# Patient Record
Sex: Male | Born: 1995 | Race: Black or African American | Hispanic: No | Marital: Single | Smoking: Never smoker
Health system: Southern US, Community
[De-identification: ages and names within clinical notes are randomized; demographics above are authoritative.]

---
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
## 2015-03-23 ENCOUNTER — Emergency Department (HOSPITAL_COMMUNITY)
Admission: EM | Admit: 2015-03-23 | Discharge: 2015-03-23 | Disposition: A | Discharge status: Home / Self Care | Payer: Medicaid Other | Attending: Emergency Medicine | Admitting: Emergency Medicine

## 2015-03-23 ENCOUNTER — Encounter (HOSPITAL_COMMUNITY): Payer: Self-pay | Admitting: *Deleted

## 2015-03-23 ENCOUNTER — Emergency Department (HOSPITAL_COMMUNITY): Payer: Medicaid Other

## 2015-03-23 DIAGNOSIS — Y998 Other external cause status: Secondary | ICD-10-CM | POA: Insufficient documentation

## 2015-03-23 DIAGNOSIS — S161XXA Strain of muscle, fascia and tendon at neck level, initial encounter: Secondary | ICD-10-CM

## 2015-03-23 DIAGNOSIS — S299XXA Unspecified injury of thorax, initial encounter: Secondary | ICD-10-CM | POA: Insufficient documentation

## 2015-03-23 DIAGNOSIS — Y92410 Unspecified street and highway as the place of occurrence of the external cause: Secondary | ICD-10-CM | POA: Insufficient documentation

## 2015-03-23 DIAGNOSIS — S0990XA Unspecified injury of head, initial encounter: Secondary | ICD-10-CM | POA: Insufficient documentation

## 2015-03-23 DIAGNOSIS — Y9389 Activity, other specified: Secondary | ICD-10-CM | POA: Insufficient documentation

## 2015-03-23 MED ORDER — METHOCARBAMOL 500 MG PO TABS: 500.0000 mg | ORAL_TABLET | Freq: Three times a day (TID) | ORAL | Status: DC | PRN

## 2015-03-23 MED ORDER — IBUPROFEN 200 MG PO TABS
600.0000 mg | ORAL_TABLET | Freq: Once | ORAL | Status: AC
  Administered 2015-03-23: 600 mg via ORAL
  Filled 2015-03-23: qty 3

## 2015-03-23 NOTE — ED Notes (Signed)
Family at bedside. 

## 2015-03-23 NOTE — ED Notes (Addendum)
Pt escorted to discharge window. Verbalized understanding discharge instructions. In no acute distress.  Pt educated that he will feel worse to tomorrow and reason to return.  Pt verbalized understanding.

## 2015-03-23 NOTE — Discharge Instructions (Signed)
Cervical Sprain °A cervical sprain is an injury in the neck in which the strong, fibrous tissues (ligaments) that connect your neck bones stretch or tear. Cervical sprains can range from mild to severe. Severe cervical sprains can cause the neck vertebrae to be unstable. This can lead to damage of the spinal cord and can result in serious nervous system problems. The amount of time it takes for a cervical sprain to get better depends on the cause and extent of the injury. Most cervical sprains heal in 1 to 3 weeks. °CAUSES  °Severe cervical sprains may be caused by:  °· Contact sport injuries (such as from football, rugby, wrestling, hockey, auto racing, gymnastics, diving, martial arts, or boxing).   °· Motor vehicle collisions.   °· Whiplash injuries. This is an injury from a sudden forward and backward whipping movement of the head and neck.  °· Falls.   °Mild cervical sprains may be caused by:  °· Being in an awkward position, such as while cradling a telephone between your ear and shoulder.   °· Sitting in a chair that does not offer proper support.   °· Working at a poorly designed computer station.   °· Looking up or down for long periods of time.   °SYMPTOMS  °· Pain, soreness, stiffness, or a burning sensation in the front, back, or sides of the neck. This discomfort may develop immediately after the injury or slowly, 24 hours or more after the injury.   °· Pain or tenderness directly in the middle of the back of the neck.   °· Shoulder or upper back pain.   °· Limited ability to move the neck.   °· Headache.   °· Dizziness.   °· Weakness, numbness, or tingling in the hands or arms.   °· Muscle spasms.   °· Difficulty swallowing or chewing.   °· Tenderness and swelling of the neck.   °DIAGNOSIS  °Most of the time your health care provider can diagnose a cervical sprain by taking your history and doing a physical exam. Your health care provider will ask about previous neck injuries and any known neck  problems, such as arthritis in the neck. X-rays may be taken to find out if there are any other problems, such as with the bones of the neck. Other tests, such as a CT scan or MRI, may also be needed.  °TREATMENT  °Treatment depends on the severity of the cervical sprain. Mild sprains can be treated with rest, keeping the neck in place (immobilization), and pain medicines. Severe cervical sprains are immediately immobilized. Further treatment is done to help with pain, muscle spasms, and other symptoms and may include: °· Medicines, such as pain relievers, numbing medicines, or muscle relaxants.   °· Physical therapy. This may involve stretching exercises, strengthening exercises, and posture training. Exercises and improved posture can help stabilize the neck, strengthen muscles, and help stop symptoms from returning.   °HOME CARE INSTRUCTIONS  °· Put ice on the injured area.   °¨ Put ice in a plastic bag.   °¨ Place a towel between your skin and the bag.   °¨ Leave the ice on for 15-20 minutes, 3-4 times a day.   °· If your injury was severe, you may have been given a cervical collar to wear. A cervical collar is a two-piece collar designed to keep your neck from moving while it heals. °¨ Do not remove the collar unless instructed by your health care provider. °¨ If you have long hair, keep it outside of the collar. °¨ Ask your health care provider before making any adjustments to your collar. Minor   adjustments may be required over time to improve comfort and reduce pressure on your chin or on the back of your head. °¨ If you are allowed to remove the collar for cleaning or bathing, follow your health care provider's instructions on how to do so safely. °¨ Keep your collar clean by wiping it with mild soap and water and drying it completely. If the collar you have been given includes removable pads, remove them every 1-2 days and hand wash them with soap and water. Allow them to air dry. They should be completely  dry before you wear them in the collar. °¨ If you are allowed to remove the collar for cleaning and bathing, wash and dry the skin of your neck. Check your skin for irritation or sores. If you see any, tell your health care provider. °¨ Do not drive while wearing the collar.   °· Only take over-the-counter or prescription medicines for pain, discomfort, or fever as directed by your health care provider.   °· Keep all follow-up appointments as directed by your health care provider.   °· Keep all physical therapy appointments as directed by your health care provider.   °· Make any needed adjustments to your workstation to promote good posture.   °· Avoid positions and activities that make your symptoms worse.   °· Warm up and stretch before being active to help prevent problems.   °SEEK MEDICAL CARE IF:  °· Your pain is not controlled with medicine.   °· You are unable to decrease your pain medicine over time as planned.   °· Your activity level is not improving as expected.   °SEEK IMMEDIATE MEDICAL CARE IF:  °· You develop any bleeding. °· You develop stomach upset. °· You have signs of an allergic reaction to your medicine.   °· Your symptoms get worse.   °· You develop new, unexplained symptoms.   °· You have numbness, tingling, weakness, or paralysis in any part of your body.   °MAKE SURE YOU:  °· Understand these instructions. °· Will watch your condition. °· Will get help right away if you are not doing well or get worse. °Document Released: 08/25/2007 Document Revised: 11/02/2013 Document Reviewed: 05/05/2013 °[employer]® Patient Information ©2015 [employer]. This information is not intended to replace advice given to you by your health care provider. Make sure you discuss any questions you have with your health care provider. ° °Motor Vehicle Collision °It is common to have multiple bruises and sore muscles after a motor vehicle collision (MVC). These tend to feel worse for the first 24 hours. You may have  the most stiffness and soreness over the first several hours. You may also feel worse when you wake up the first morning after your collision. After this point, you will usually begin to improve with each day. The speed of improvement often depends on the severity of the collision, the number of injuries, and the location and nature of these injuries. °HOME CARE INSTRUCTIONS °· Put ice on the injured area. °¨ Put ice in a plastic bag. °¨ Place a towel between your skin and the bag. °¨ Leave the ice on for 15-20 minutes, 3-4 times a day, or as directed by your health care provider. °· Drink enough fluids to keep your urine clear or pale yellow. Do not drink alcohol. °· Take a warm shower or bath once or twice a day. This will increase blood flow to sore muscles. °· You may return to activities as directed by your caregiver. Be careful when lifting, as this may aggravate neck or back   pain. °· Only take over-the-counter or prescription medicines for pain, discomfort, or fever as directed by your caregiver. Do not use aspirin. This may increase bruising and bleeding. °SEEK IMMEDIATE MEDICAL CARE IF: °· You have numbness, tingling, or weakness in the arms or legs. °· You develop severe headaches not relieved with medicine. °· You have severe neck pain, especially tenderness in the middle of the back of your neck. °· You have changes in bowel or bladder control. °· There is increasing pain in any area of the body. °· You have shortness of breath, light-headedness, dizziness, or fainting. °· You have chest pain. °· You feel sick to your stomach (nauseous), throw up (vomit), or sweat. °· You have increasing abdominal discomfort. °· There is blood in your urine, stool, or vomit. °· You have pain in your shoulder (shoulder strap areas). °· You feel your symptoms are getting worse. °MAKE SURE YOU: °· Understand these instructions. °· Will watch your condition. °· Will get help right away if you are not doing well or get  worse. °Document Released: 10/28/2005 Document Revised: 03/14/2014 Document Reviewed: 03/27/2011 °Exit[employer]® Patient Information ©2015 [employer]. This information is not intended to replace advice given to you by your health care provider. Make sure you discuss any questions you have with your health care provider. ° °
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
## 2015-04-04 ENCOUNTER — Emergency Department (HOSPITAL_COMMUNITY): Payer: Medicaid Other

## 2015-04-04 ENCOUNTER — Encounter (HOSPITAL_COMMUNITY): Payer: Self-pay

## 2015-04-04 ENCOUNTER — Emergency Department (HOSPITAL_COMMUNITY)
Admission: EM | Admit: 2015-04-04 | Discharge: 2015-04-06 | Disposition: A | Discharge status: Home / Self Care | Payer: Self-pay | Attending: Emergency Medicine | Admitting: Emergency Medicine

## 2015-04-04 DIAGNOSIS — F0630 Mood disorder due to known physiological condition, unspecified: Secondary | ICD-10-CM

## 2015-04-04 DIAGNOSIS — R4182 Altered mental status, unspecified: Secondary | ICD-10-CM | POA: Insufficient documentation

## 2015-04-04 DIAGNOSIS — R443 Hallucinations, unspecified: Secondary | ICD-10-CM

## 2015-04-04 DIAGNOSIS — F1994 Other psychoactive substance use, unspecified with psychoactive substance-induced mood disorder: Secondary | ICD-10-CM

## 2015-04-04 LAB — CBC
HCT: 44.3 % (ref 39.0–52.0)
HEMOGLOBIN: 15.5 g/dL (ref 13.0–17.0)
MCH: 31.3 pg (ref 26.0–34.0)
MCHC: 35 g/dL (ref 30.0–36.0)
MCV: 89.3 fL (ref 78.0–100.0)
Platelets: 236 10*3/uL (ref 150–400)
RBC: 4.96 MIL/uL (ref 4.22–5.81)
RDW: 12 % (ref 11.5–15.5)
WBC: 15.8 10*3/uL — ABNORMAL HIGH (ref 4.0–10.5)

## 2015-04-04 LAB — COMPREHENSIVE METABOLIC PANEL
ALK PHOS: 62 U/L (ref 38–126)
ALT: 16 U/L — ABNORMAL LOW (ref 17–63)
ANION GAP: 13 (ref 5–15)
AST: 25 U/L (ref 15–41)
Albumin: 5.2 g/dL — ABNORMAL HIGH (ref 3.5–5.0)
BUN: 20 mg/dL (ref 6–20)
CO2: 25 mmol/L (ref 22–32)
Calcium: 9.9 mg/dL (ref 8.9–10.3)
Chloride: 100 mmol/L — ABNORMAL LOW (ref 101–111)
Creatinine, Ser: 1.16 mg/dL (ref 0.61–1.24)
GFR calc non Af Amer: >60 mL/min (ref >60)
Glucose, Bld: 145 mg/dL — ABNORMAL HIGH (ref 65–99)
POTASSIUM: 4 mmol/L (ref 3.5–5.1)
SODIUM: 138 mmol/L (ref 135–145)
Total Bilirubin: 0.8 mg/dL (ref 0.3–1.2)
Total Protein: 8.4 g/dL — ABNORMAL HIGH (ref 6.5–8.1)

## 2015-04-04 LAB — RAPID URINE DRUG SCREEN, HOSP PERFORMED
Amphetamines: NOT DETECTED
BARBITURATES: NOT DETECTED
BENZODIAZEPINES: NOT DETECTED
Cocaine: NOT DETECTED
Opiates: NOT DETECTED
Tetrahydrocannabinol: POSITIVE — AB

## 2015-04-04 LAB — ETHANOL: Alcohol, Ethyl (B): <5 mg/dL (ref <5)

## 2015-04-04 LAB — ACETAMINOPHEN LEVEL: Acetaminophen (Tylenol), Serum: <10 ug/mL — ABNORMAL LOW (ref 10–30)

## 2015-04-04 LAB — SALICYLATE LEVEL

## 2015-04-04 MED ORDER — ZOLPIDEM TARTRATE 5 MG PO TABS
5.0000 mg | ORAL_TABLET | Freq: Every evening | ORAL | Status: DC | PRN
  Administered 2015-04-04: 5 mg via ORAL
  Filled 2015-04-04: qty 1

## 2015-04-04 MED ORDER — ALUM & MAG HYDROXIDE-SIMETH 200-200-20 MG/5ML PO SUSP: 30.0000 mL | ORAL | Status: DC | PRN

## 2015-04-04 MED ORDER — ACETAMINOPHEN 325 MG PO TABS: 650.0000 mg | ORAL_TABLET | ORAL | Status: DC | PRN

## 2015-04-04 MED ORDER — LORAZEPAM 1 MG PO TABS: 1.0000 mg | ORAL_TABLET | Freq: Once | ORAL | Status: DC

## 2015-04-04 MED ORDER — ONDANSETRON HCL 4 MG PO TABS: 4.0000 mg | ORAL_TABLET | Freq: Three times a day (TID) | ORAL | Status: DC | PRN

## 2015-04-04 MED ORDER — IBUPROFEN 200 MG PO TABS: 600.0000 mg | ORAL_TABLET | Freq: Three times a day (TID) | ORAL | Status: DC | PRN

## 2015-04-04 MED ORDER — LORAZEPAM 1 MG PO TABS
1.0000 mg | ORAL_TABLET | Freq: Three times a day (TID) | ORAL | Status: DC | PRN
  Administered 2015-04-04: 1 mg via ORAL
  Filled 2015-04-04: qty 1

## 2015-04-04 NOTE — ED Notes (Signed)
Per Mom, pt has taken an unknown substance earlier today.  Pt is lethargic and speech is slurred.  Does not know what he took.  He is having hallucinations

## 2015-04-04 NOTE — ED Notes (Signed)
Pt attempted to get up and talk with the assessment counselors and he was unable to. Pt was assisted to get up and go to the restroom and was unsteady on his feet.
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
## 2015-04-04 NOTE — ED Notes (Signed)
Pt threw down his drink to the floor and attempted to get out of the bed after being asked to stay into bed. Pt yelling out of his room for his medicines. When asked what medications that he was wanting pt was unable to tell me. Pt then attempted to grab staff and was redirected and told not to do this.

## 2015-04-04 NOTE — BH Assessment (Addendum)
Reviewed ED notes prior to initiating assessment. Per family reports pt was quieter than usual yesterday and then exhibited bizarre behaviors starting around 3 am. Pt ingested something but is not sure what, friend told him it was Xanax, pt did not test positive for Xanax, did test positive for THC. Pt reports visual hallucinations.    Requested cart be placed with pt for assessment. Per nurse Sedonia SmallNatashia pt is very sedated, and had to be assisted with changing his clothes. She states he is not able to participate in an assessment. She will contact TTS at [phone] or [phone] when pt is ready to be assessed.   Informed Dr. Micheline Mazeocherty what nurse had stated she stated pt did not make much sense was alert enough to be assessed. She went into room and was able to arouse pt. Assessment to commence shortly.   Attempted to assess pt. He was having trouble holding his head up and answering questions. He called for nurse and she had to physically assist him in using the restroom as pt could not ambulate or take down his pants independently. Informed EDP. [facility] will attempt to assess Face to Face and if unable to do so due to pt's drowsy state will inform EDP.    Clista BernhardtNancy Cypher Paule, [facility] Triage Specialist 04/04/2015 9:51 PM

## 2015-04-04 NOTE — ED Notes (Signed)
Bed: WA28 Expected date:  Expected time:  Means of arrival:  Comments: 

## 2015-04-05 DIAGNOSIS — F0630 Mood disorder due to known physiological condition, unspecified: Secondary | ICD-10-CM

## 2015-04-05 DIAGNOSIS — R443 Hallucinations, unspecified: Secondary | ICD-10-CM | POA: Insufficient documentation

## 2015-04-05 DIAGNOSIS — F1994 Other psychoactive substance use, unspecified with psychoactive substance-induced mood disorder: Secondary | ICD-10-CM

## 2015-04-05 MED ORDER — DIPHENHYDRAMINE HCL 25 MG PO CAPS
50.0000 mg | ORAL_CAPSULE | Freq: Once | ORAL | Status: AC
  Administered 2015-04-05: 50 mg via ORAL
  Filled 2015-04-05: qty 2

## 2015-04-05 MED ORDER — TRAZODONE HCL 50 MG PO TABS
50.0000 mg | ORAL_TABLET | Freq: Every evening | ORAL | Status: DC | PRN
  Administered 2015-04-05: 50 mg via ORAL
  Filled 2015-04-05: qty 1

## 2015-04-05 MED ORDER — DIPHENHYDRAMINE HCL 50 MG/ML IJ SOLN
50.0000 mg | Freq: Once | INTRAMUSCULAR | Status: AC
  Administered 2015-04-05: 50 mg via INTRAMUSCULAR
  Filled 2015-04-05: qty 1

## 2015-04-05 MED ORDER — OLANZAPINE 10 MG PO TBDP
10.0000 mg | ORAL_TABLET | Freq: Three times a day (TID) | ORAL | Status: DC | PRN
  Administered 2015-04-05: 10 mg via ORAL
  Filled 2015-04-05: qty 1

## 2015-04-05 MED ORDER — HALOPERIDOL LACTATE 5 MG/ML IJ SOLN
INTRAMUSCULAR | Status: AC
  Administered 2015-04-05: 5 mg via INTRAMUSCULAR
  Filled 2015-04-05: qty 1

## 2015-04-05 MED ORDER — LORAZEPAM 2 MG/ML IJ SOLN
2.0000 mg | Freq: Once | INTRAMUSCULAR | Status: AC
  Administered 2015-04-05: 2 mg via INTRAMUSCULAR
  Filled 2015-04-05: qty 1

## 2015-04-05 MED ORDER — HALOPERIDOL LACTATE 5 MG/ML IJ SOLN: 5.0000 mg | Freq: Once | INTRAMUSCULAR | Status: AC

## 2015-04-05 NOTE — ED Notes (Signed)
Mother at bedside, patient sleeping.

## 2015-04-05 NOTE — BH Assessment (Signed)
Tele Assessment Note   Lance Nguyen is an 19 y.o. male.  -Clinician talked with Dr. Micheline Maze about need for TTS.  Patient was brought in by parents who noticed that for the last day or so pt has been acting bizarrely.  Patient said that he took a pill but cannot name what it was.  We discussed taking out the consult request but the nurse informed clinician that patient was up and talking at the edge of his bed.  Patient is still not making much sense when you ask him questions.  He does not know where he is or the date and day.  When asked about SI or HI patient says "No."  Patient will occasionally look behind him as if someone is there.  He will look into the hallway as if looking at someone.  Patient still is not oriented.  He does have a ankle bracelet for tracking that he is worried about.  He will say things that lead you to believe he is talking about him not being in the right place for the ankle monitor.  Patient is unable to say what pill he may have taken was.  Patient's friends told parents he took a xanax.  Only THC was on UDS.  -Clinician discussed disposition with Donell Sievert, PA who recommends inpatient care but that patient needs AM psych evaluation when more coherent.  Clinician did talk to Dr. Micheline Maze about patient needing to be seen by psychiatry in AM on 05/25.  Axis I: Substance Abuse and Substance Induced Mood Disorder Axis II: Deferred Axis III: History reviewed. No pertinent past medical history. Axis IV: other psychosocial or environmental problems, problems related to legal system/crime and problems with access to health care services Axis V: 21-30 behavior considerably influenced by delusions or hallucinations OR serious impairment in judgment, communication OR inability to function in almost all areas  Past Medical History: History reviewed. No pertinent past medical history.  History reviewed. No pertinent past surgical history.  Family History: History  reviewed. No pertinent family history.  Social History:  reports that he has never smoked. He does not have any smokeless tobacco history on file. He reports that he does not drink alcohol or use illicit drugs.  Additional Social History:  Alcohol / Drug Use Pain Medications: Pt unable to tell Prescriptions:  Pt unable to tell Over the Counter: Pt unable to tell History of alcohol / drug use?: Yes Substance #1 Name of Substance 1: Marijuana 1 - Age of First Use: Teens 1 - Amount (size/oz): Unknown 1 - Frequency: Unable to assess 1 - Duration: Unable to assess. 1 - Last Use / Amount: Unknown  CIWA: CIWA-Ar BP: 145/85 mmHg Pulse Rate: 77 COWS:    PATIENT STRENGTHS: (choose at least two) Average or above average intelligence Supportive family/friends  Allergies: No Known Allergies  Home Medications:  (Not in a hospital admission)  OB/GYN Status:  No LMP for male patient.  General Assessment Data Location of Assessment: [facility] TTS Assessment: In system Is this a Tele or Face-to-Face Assessment?: Face-to-Face Is this an Initial Assessment or a Re-assessment for this encounter?: Initial Assessment Marital status: Single Is patient pregnant?: No Pregnancy Status: No Living Arrangements: Parent Can pt return to current living arrangement?: Yes Admission Status: Voluntary Is patient capable of signing voluntary admission?: No Referral Source: Self/Family/Friend Insurance type: self pay     Crisis Care Plan Living Arrangements: Parent Name of Psychiatrist: Unknown Name of Therapist: Unknown  Risk to self with the past 6 months Suicidal Ideation: No Has patient been a risk to self within the past 6 months prior to admission? : No Suicidal Intent: No Has patient had any suicidal intent within the past 6 months prior to admission? : No Is patient at risk for suicide?: No Suicidal Plan?: No Has patient had any suicidal plan within the past 6 months prior to  admission? : No Access to Means: No What has been your use of drugs/alcohol within the last 12 months?: Pills & THC Previous Attempts/Gestures: No How many times?: 0 Other Self Harm Risks: Unknown Triggers for Past Attempts: None known Intentional Self Injurious Behavior: None Family Suicide History: Unable to assess Recent stressful life event(s): Legal Issues (Pt has ankle monitor in left leg) Persecutory voices/beliefs?: Yes Depression: No Depression Symptoms:  (Unable to assess) Substance abuse history and/or treatment for substance abuse?: Yes Suicide prevention information given to non-admitted patients: Not applicable  Risk to Others within the past 6 months Homicidal Ideation: No Does patient have any lifetime risk of violence toward others beyond the six months prior to admission? : No Thoughts of Harm to Others: No Current Homicidal Intent: No Current Homicidal Plan: No Access to Homicidal Means: No Identified Victim: No one History of harm to others?: No Assessment of Violence: None Noted Violent Behavior Description: Unknown Does patient have access to weapons?: No Criminal Charges Pending?: Yes Describe Pending Criminal Charges: Pt could not say Does patient have a court date: Yes Court Date: 04/07/15 (Pt unsure but mentioned 05/27) Is patient on probation?: Yes  Psychosis Hallucinations: Auditory, Visual Delusions: None noted  Mental Status Report Appearance/Hygiene: Disheveled, In scrubs Eye Contact: Fair Motor Activity: Unsteady Speech: Rapid, Pressured, Slurred Level of Consciousness: Alert Mood: Anxious, Suspicious, Apprehensive, Helpless Affect: Apprehensive, Frightened Anxiety Level: Severe Thought Processes: Irrelevant, Flight of Ideas Judgement: Impaired Orientation: Not oriented Obsessive Compulsive Thoughts/Behaviors: None  Cognitive Functioning Concentration: Decreased Memory: Recent Impaired, Remote Impaired IQ: Average Insight:  Poor Impulse Control: Poor Appetite:  (Unable to assess) Weight Loss: 0 Weight Gain: 0 Sleep: Unable to Assess Total Hours of Sleep:  (Unable to assess) Vegetative Symptoms: Unable to Assess  ADLScreening [facility] Assessment Services) Patient's cognitive ability adequate to safely complete daily activities?: Yes Patient able to express need for assistance with ADLs?: Yes Independently performs ADLs?: Yes (appropriate for developmental age)  Prior Inpatient Therapy Prior Inpatient Therapy: No Prior Therapy Dates: Unknown Prior Therapy Facilty/Provider(s): Unknown Reason for Treatment: Unknown  Prior Outpatient Therapy Prior Outpatient Therapy: No Prior Therapy Dates: Unknown Prior Therapy Facilty/Provider(s): Unknown Reason for Treatment: Unknown Does patient have an ACCT team?: Unknown Does patient have Intensive In-House Services?  : Unknown Does patient have [facility] services? : Unknown Does patient have P4CC services?: Unknown  ADL Screening (condition at time of admission) Patient's cognitive ability adequate to safely complete daily activities?: Yes Is the patient deaf or have difficulty hearing?: No Does the patient have difficulty seeing, even when wearing glasses/contacts?: No Does the patient have difficulty concentrating, remembering, or making decisions?: Yes Patient able to express need for assistance with ADLs?: Yes Does the patient have difficulty dressing or bathing?: No Independently performs ADLs?: Yes (appropriate for developmental age) Does the patient have difficulty walking or climbing stairs?: Yes (Unsteady on feet at this time.) Weakness of Legs: None Weakness of Arms/Hands: None       Abuse/Neglect Assessment (Assessment to be complete while patient is alone) Physical Abuse: Denies (unable to assess) Verbal Abuse:  Denies (Unable to assess) Sexual Abuse: Denies (unable to assess) Exploitation of patient/patient's resources: Denies Self-Neglect:  Denies     Merchant navy officerAdvance Directives (For Healthcare) Does patient have an advance directive?: No Would patient like information on creating an advanced directive?: No - patient declined information    Additional Information 1:1 In Past 12 Months?: No CIRT Risk: No Elopement Risk: No Does patient have medical clearance?: Yes     Disposition:  Disposition Initial Assessment Completed for this Encounter: Yes Disposition of Patient: Other dispositions Other disposition(s): Other (Comment) (To be reviewed with PA)  Beatriz StallionHarvey, Farhana Fellows Ray 04/05/2015 12:41 AM

## 2015-04-05 NOTE — Progress Notes (Signed)
[facility] with a steady gait. On initial approach pt was paranoid and confused. Per pt " I smoked some bad weed". Pt's mother stated that he took a pill and it was white but they don't know what it was. Pt was observed talking to a male called Victorino DikeJennifer while looking at the chair and the wall in his room at time of assessment. Pt was reoriented to reality. Pt was medicated (Benadryl and Ativan) earlier this afternoon for profuse sweating, restlessness/unable to sit / lie still in bed, disorganized behavior. Pt was calm post medication administration and was verbally redirectable then. Pt does have a house arrest bracelet on his left ankle and charger was brought in by his mother. Pt visible at nursing station at present, charging bracelet. Q 15 minutes checks maintained for safety as ordered and pt monitored as such without incidents thus far.

## 2015-04-05 NOTE — ED Notes (Signed)
Report received. Pt up and down, sleeping intermittently. Continues to be confused, calm, redirectable.

## 2015-04-05 NOTE — BHH Counselor (Addendum)
[facility] Assessment Progress Note  Pt has been [facility] for [facility] consideration [facility], [facility], [facility], [facility], [facility]    [facility] at [facility], per [place].     Ref[facility] also faxed to [facility], [facility], and [facility].  [place] M. Ladona Ridgelaylor, MS, [facility] Observation Counselor

## 2015-04-05 NOTE — ED Notes (Signed)
Pt awake, alert & responsive, no distress noted, ankle bracelet charging at present, RN Dois DavenportSandra at bedside, father at bedside visiting pt at present. Pt very paranoid, responding to internal stimuli.  Monitoring for safety, Q 15 min checks in effect.

## 2015-04-05 NOTE — Consult Note (Signed)
[facility] Consult   Reason for Consult:  Substance induced psychosis Referring Physician:  EDP Patient Identification: MACAI SISNEROS MRN:  [id number] Principal Diagnosis: Psychoactive substance-induced mood disorder Diagnosis:   Patient Active Problem List   Diagnosis Date Noted  . Psychoactive substance-induced mood disorder [F19.94, F06.30] 04/05/2015    Priority: High    Total Time spent with patient: 45 minutes  Subjective:   Lance Nguyen is a 19 y.o. male patient will be re-evaluated in am once his drugs have cleared.  HPI:  The patient presented to the ED with bizarre behaviors.  He was praying, delusional, and unstable.  His parents report he took a pill that he did not know and started acting strangely at the house.  The behaviors increased and they brought him to the ED.  He has a past history of substance abuse and is with [facility].  This afternoon, he started sweating and could not sit still--PRN Benadryl and Ativan IM were given.  He is much calmer, clearer, and coherent. HPI Elements:   Location:  generalized. Quality:  acute. Severity:  moderate. Timing:  constant. Duration:  since yesterday. Context:  drug abuse.  Past Medical History: History reviewed. No pertinent past medical history. History reviewed. No pertinent past surgical history. Family History: History reviewed. No pertinent family history. Social History:  History  Alcohol Use No     History  Drug Use No    History   Social History  . Marital Status: Single    Spouse Name: N/A  . Number of Children: N/A  . Years of Education: N/A   Social History Main Topics  . Smoking status: Never Smoker   . Smokeless tobacco: Not on file  . Alcohol Use: No  . Drug Use: No  . Sexual Activity: Not on file   Other Topics Concern  . None   Social History Narrative   Additional Social History:    Pain Medications: Pt unable to tell Prescriptions:  Pt unable to tell Over  the Counter: Pt unable to tell History of alcohol / drug use?: Yes Name of Substance 1: Marijuana 1 - Age of First Use: Teens 1 - Amount (size/oz): Unknown 1 - Frequency: Unable to assess 1 - Duration: Unable to assess. 1 - Last Use / Amount: Unknown                   Allergies:  No Known Allergies  Labs:  Results for orders placed or performed during the hospital encounter of 04/04/15 (from the past 48 hour(s))  Acetaminophen level     Status: Abnormal   Collection Time: 04/04/15  6:04 PM  Result Value Ref Range   Acetaminophen (Tylenol), Serum <10 (L) 10 - 30 ug/mL    Comment:        THERAPEUTIC CONCENTRATIONS VARY SIGNIFICANTLY. A RANGE OF 10-30 ug/mL MAY BE AN EFFECTIVE CONCENTRATION FOR MANY PATIENTS. HOWEVER, SOME ARE BEST TREATED AT CONCENTRATIONS OUTSIDE THIS RANGE. ACETAMINOPHEN CONCENTRATIONS >150 ug/mL AT 4 HOURS AFTER INGESTION AND >50 ug/mL AT 12 HOURS AFTER INGESTION ARE OFTEN ASSOCIATED WITH TOXIC REACTIONS.   CBC     Status: Abnormal   Collection Time: 04/04/15  6:04 PM  Result Value Ref Range   WBC 15.8 (H) 4.0 - 10.5 K/uL   RBC 4.96 4.22 - 5.81 MIL/uL   Hemoglobin 15.5 13.0 - 17.0 g/dL   HCT 44.3 39.0 - 52.0 %   MCV 89.3 78.0 - 100.0 fL   MCH  31.3 26.0 - 34.0 pg   MCHC 35.0 30.0 - 36.0 g/dL   RDW 12.0 11.5 - 15.5 %   Platelets 236 150 - 400 K/uL  Comprehensive metabolic panel     Status: Abnormal   Collection Time: 04/04/15  6:04 PM  Result Value Ref Range   Sodium 138 135 - 145 mmol/L   Potassium 4.0 3.5 - 5.1 mmol/L   Chloride 100 (L) 101 - 111 mmol/L   CO2 25 22 - 32 mmol/L   Glucose, Bld 145 (H) 65 - 99 mg/dL   BUN 20 6 - 20 mg/dL   Creatinine, Ser 1.16 0.61 - 1.24 mg/dL   Calcium 9.9 8.9 - 10.3 mg/dL   Total Protein 8.4 (H) 6.5 - 8.1 g/dL   Albumin 5.2 (H) 3.5 - 5.0 g/dL   AST 25 15 - 41 U/L   ALT 16 (L) 17 - 63 U/L   Alkaline Phosphatase 62 38 - 126 U/L   Total Bilirubin 0.8 0.3 - 1.2 mg/dL   GFR calc non Af Amer >60 >60  mL/min   GFR calc Af Amer >60 >60 mL/min    Comment: (NOTE) The eGFR has been calculated using the CKD EPI equation. This calculation has not been validated in all clinical situations. eGFR's persistently <60 mL/min signify possible Chronic Kidney Disease.    Anion gap 13 5 - 15  Ethanol (ETOH)     Status: None   Collection Time: 04/04/15  6:04 PM  Result Value Ref Range   Alcohol, Ethyl (B) <5 <5 mg/dL    Comment:        LOWEST DETECTABLE LIMIT FOR SERUM ALCOHOL IS 11 mg/dL FOR MEDICAL PURPOSES ONLY   Salicylate level     Status: None   Collection Time: 04/04/15  6:04 PM  Result Value Ref Range   Salicylate Lvl <0.7 2.8 - 30.0 mg/dL  Urine Drug Screen     Status: Abnormal   Collection Time: 04/04/15  6:28 PM  Result Value Ref Range   Opiates NONE DETECTED NONE DETECTED   Cocaine NONE DETECTED NONE DETECTED   Benzodiazepines NONE DETECTED NONE DETECTED   Amphetamines NONE DETECTED NONE DETECTED   Tetrahydrocannabinol POSITIVE (A) NONE DETECTED   Barbiturates NONE DETECTED NONE DETECTED    Comment:        DRUG SCREEN FOR MEDICAL PURPOSES ONLY.  IF CONFIRMATION IS NEEDED FOR ANY PURPOSE, NOTIFY LAB WITHIN 5 DAYS.        LOWEST DETECTABLE LIMITS FOR URINE DRUG SCREEN Drug Class       Cutoff (ng/mL) Amphetamine      1000 Barbiturate      200 Benzodiazepine   622 Tricyclics       633 Opiates          300 Cocaine          300 THC              50     Vitals: Blood pressure 149/77, pulse 77, temperature 98.2 F (36.8 C), temperature source Oral, resp. rate 18, SpO2 100 %.  Risk to Self: Suicidal Ideation: No Suicidal Intent: No Is patient at risk for suicide?: No Suicidal Plan?: No Access to Means: No What has been your use of drugs/alcohol within the last 12 months?: Pills & THC How many times?: 0 Other Self Harm Risks: Unknown Triggers for Past Attempts: None known Intentional Self Injurious Behavior: None Risk to Others: Homicidal Ideation: No Thoughts of  Harm to Others:  No Current Homicidal Intent: No Current Homicidal Plan: No Access to Homicidal Means: No Identified Victim: No one History of harm to others?: No Assessment of Violence: None Noted Violent Behavior Description: Unknown Does patient have access to weapons?: No Criminal Charges Pending?: Yes Describe Pending Criminal Charges: Pt could not say Does patient have a court date: Yes Court Date: 04/07/15 (Pt unsure but mentioned 05/27) Prior Inpatient Therapy: Prior Inpatient Therapy: No Prior Therapy Dates: Unknown Prior Therapy Facilty/Provider(s): Unknown Reason for Treatment: Unknown Prior Outpatient Therapy: Prior Outpatient Therapy: No Prior Therapy Dates: Unknown Prior Therapy Facilty/Provider(s): Unknown Reason for Treatment: Unknown Does patient have an ACCT team?: Unknown Does patient have Intensive In-House Services?  : Unknown Does patient have [facility] services? : Unknown Does patient have [facility] services?: Unknown  Current Facility-Administered Medications  Medication Dose Route Frequency Provider Last Rate Last Dose  . acetaminophen (TYLENOL) tablet 650 mg  650 mg Oral Q4H PRN Ernestina Patches, MD      . alum & mag hydroxide-simeth (MAALOX/MYLANTA) 200-200-20 MG/5ML suspension 30 mL  30 mL Oral PRN Ernestina Patches, MD      . ibuprofen (ADVIL,MOTRIN) tablet 600 mg  600 mg Oral Q8H PRN Ernestina Patches, MD      . OLANZapine zydis (ZYPREXA) disintegrating tablet 10 mg  10 mg Oral Q8H PRN Vernesha Talbot      . ondansetron (ZOFRAN) tablet 4 mg  4 mg Oral Q8H PRN Ernestina Patches, MD      . traZODone (DESYREL) tablet 50 mg  50 mg Oral QHS PRN Darnell Jeschke       Current Outpatient Prescriptions  Medication Sig Dispense Refill  . methocarbamol (ROBAXIN) 500 MG tablet Take 1 tablet (500 mg total) by mouth every 8 (eight) hours as needed for muscle spasms. (Patient not taking: Reported on 04/04/2015) 15 tablet 0    Musculoskeletal: Strength & Muscle Tone: within normal  limits Gait & Station: normal Patient leans: N/A  Psychiatric Specialty Exam: Physical Exam  Review of Systems  Constitutional: Positive for diaphoresis.  HENT: Negative.   Eyes: Negative.   Respiratory: Negative.   Cardiovascular: Negative.   Gastrointestinal: Negative.   Genitourinary: Negative.   Musculoskeletal: Negative.   Skin: Negative.   Neurological: Negative.   Endo/Heme/Allergies: Negative.   Psychiatric/Behavioral: Positive for hallucinations and substance abuse. The patient is nervous/anxious.     Blood pressure 149/77, pulse 77, temperature 98.2 F (36.8 C), temperature source Oral, resp. rate 18, SpO2 100 %.There is no height or weight on file to calculate BMI.  General Appearance: Casual  Eye Contact::  Minimal  Speech:  Normal Rate  Volume:  Normal  Mood:  Anxious  Affect:  Blunt  Thought Process:  Disorganized  Orientation:  Other:  self  Thought Content:  Delusions  Suicidal Thoughts:  No  Homicidal Thoughts:  No  Memory:  Immediate;   Poor Recent;   Poor Remote;   Poor  Judgement:  Impaired  Insight:  Lacking  Psychomotor Activity:  Increased  Concentration:  Poor  Recall:  Poor  Fund of Knowledge:Fair  Language: Fair  Akathisia:  No  Handed:  Right  AIMS (if indicated):     Assets:  Housing Leisure Time Physical Health Resilience Social Support  ADL's:  Intact  Cognition: WNL  Sleep:      Medical Decision Making: Review of Psycho-Social Stressors (1), Review or order clinical lab tests (1) and Review of Medication Regimen & Side Effects (2)  Treatment Plan Summary: Daily contact with  patient to assess and evaluate symptoms and progress in treatment, Medication management and Plan re-evaluate in the am for stability  Plan:  Supportive therapy provided about ongoing stressors. Disposition: re-evaluate in the am for stability, once drug clears  Waylan Boga, [place] 04/05/2015 3:21 PM Patient seen face-to-face for psychiatric  evaluation, chart reviewed and case discussed with the physician extender and developed treatment plan. Reviewed the information documented and agree with the treatment plan. Corena Pilgrim, MD

## 2015-04-05 NOTE — ED Notes (Signed)
Patient is paranoid and confused.  Stated "I smoked some bad weed. The guys are out there to get you with their guns." Non-aggressive.  Reoriented to reality.  Denies physical discomfort.

## 2015-04-05 NOTE — ED Notes (Addendum)
Pt barricaded himself in the bathroom and started yelling that he was going to be shot and that his brother was missing and that his family was being killed. Finally with assistance from hospital security and the NT we were able to talk the patient out of the room. MD notified. Pt is also guarded and suspicious while being paranoid. Pt is having active auditory and visual hallucinations with people that are not present. Pt will not respond to staff when asked who is he talking to and about what. Pt is refusing to go into his room because he feels that it is a set up. Pt is sitting in front of his door in a chair.

## 2015-04-05 NOTE — ED Notes (Signed)
Pt visiting with BH staff 

## 2015-04-05 NOTE — ED Notes (Signed)
Pt psychotic, threw papers in room, cursing to self, talking loud, attempted to get something out of RN Sandra's pocket and attempted to grab vital signs machine, redirected back to room.

## 2015-04-06 ENCOUNTER — Inpatient Hospital Stay (HOSPITAL_COMMUNITY)
Admission: AD | Admit: 2015-04-06 | Discharge: 2015-04-08 | DRG: 885 | Disposition: A | Discharge status: Other Acute Inpatient Hospital | Payer: Federal, State, Local not specified - Other | Source: Intra-hospital | Attending: Psychiatry | Admitting: Psychiatry

## 2015-04-06 ENCOUNTER — Encounter (HOSPITAL_COMMUNITY): Payer: Self-pay | Admitting: Behavioral Health

## 2015-04-06 DIAGNOSIS — R4182 Altered mental status, unspecified: Secondary | ICD-10-CM | POA: Insufficient documentation

## 2015-04-06 DIAGNOSIS — F1994 Other psychoactive substance use, unspecified with psychoactive substance-induced mood disorder: Secondary | ICD-10-CM

## 2015-04-06 DIAGNOSIS — F0630 Mood disorder due to known physiological condition, unspecified: Secondary | ICD-10-CM

## 2015-04-06 DIAGNOSIS — F1220 Cannabis dependence, uncomplicated: Secondary | ICD-10-CM | POA: Diagnosis present

## 2015-04-06 DIAGNOSIS — F29 Unspecified psychosis not due to a substance or known physiological condition: Principal | ICD-10-CM | POA: Diagnosis present

## 2015-04-06 MED ORDER — ZIPRASIDONE MESYLATE 20 MG IM SOLR: 20.0000 mg | INTRAMUSCULAR | Status: DC | PRN

## 2015-04-06 MED ORDER — BENZTROPINE MESYLATE 1 MG/ML IJ SOLN
2.0000 mg | Freq: Once | INTRAMUSCULAR | Status: AC
  Administered 2015-04-06: 2 mg via INTRAMUSCULAR
  Filled 2015-04-06: qty 2

## 2015-04-06 MED ORDER — ZIPRASIDONE MESYLATE 20 MG IM SOLR
10.0000 mg | Freq: Once | INTRAMUSCULAR | Status: AC
  Administered 2015-04-06: 10 mg via INTRAMUSCULAR

## 2015-04-06 MED ORDER — TRAZODONE HCL 50 MG PO TABS: 50.0000 mg | ORAL_TABLET | Freq: Every evening | ORAL | Status: DC | PRN

## 2015-04-06 MED ORDER — HALOPERIDOL LACTATE 5 MG/ML IJ SOLN
5.0000 mg | Freq: Once | INTRAMUSCULAR | Status: AC
  Administered 2015-04-06: 5 mg via INTRAMUSCULAR
  Filled 2015-04-06: qty 1

## 2015-04-06 MED ORDER — HALOPERIDOL 5 MG PO TABS
5.0000 mg | ORAL_TABLET | Freq: Four times a day (QID) | ORAL | Status: DC | PRN
  Administered 2015-04-06: 5 mg via ORAL
  Filled 2015-04-06: qty 1

## 2015-04-06 MED ORDER — ZIPRASIDONE MESYLATE 20 MG IM SOLR
10.0000 mg | Freq: Once | INTRAMUSCULAR | Status: AC
  Administered 2015-04-06: 10 mg via INTRAMUSCULAR
  Filled 2015-04-06: qty 20

## 2015-04-06 MED ORDER — LORAZEPAM 0.5 MG PO TABS: 0.5000 mg | ORAL_TABLET | Freq: Three times a day (TID) | ORAL | Status: DC | PRN

## 2015-04-06 MED ORDER — LORAZEPAM 1 MG PO TABS
1.0000 mg | ORAL_TABLET | ORAL | Status: AC | PRN
  Administered 2015-04-07: 1 mg via ORAL
  Filled 2015-04-06: qty 1

## 2015-04-06 MED ORDER — DIPHENHYDRAMINE HCL 50 MG/ML IJ SOLN
50.0000 mg | Freq: Once | INTRAMUSCULAR | Status: AC
  Administered 2015-04-06: 50 mg via INTRAMUSCULAR
  Filled 2015-04-06: qty 1

## 2015-04-06 MED ORDER — TRAZODONE HCL 50 MG PO TABS
50.0000 mg | ORAL_TABLET | Freq: Every evening | ORAL | Status: DC | PRN
  Administered 2015-04-06: 50 mg via ORAL
  Filled 2015-04-06 (×2): qty 1

## 2015-04-06 MED ORDER — ZIPRASIDONE MESYLATE 20 MG IM SOLR
20.0000 mg | Freq: Once | INTRAMUSCULAR | Status: AC
  Administered 2015-04-06: 20 mg via INTRAMUSCULAR
  Filled 2015-04-06: qty 20

## 2015-04-06 MED ORDER — BENZTROPINE MESYLATE 1 MG/ML IJ SOLN
INTRAMUSCULAR | Status: AC
  Administered 2015-04-06: 2 mg via INTRAMUSCULAR
  Filled 2015-04-06: qty 2

## 2015-04-06 MED ORDER — LORAZEPAM 1 MG PO TABS
2.0000 mg | ORAL_TABLET | Freq: Once | ORAL | Status: AC
  Administered 2015-04-06: 2 mg via ORAL
  Filled 2015-04-06: qty 2

## 2015-04-06 MED ORDER — LORAZEPAM 0.5 MG PO TABS: 0.5000 mg | ORAL_TABLET | Freq: Two times a day (BID) | ORAL | Status: DC

## 2015-04-06 MED ORDER — DIPHENHYDRAMINE HCL 25 MG PO CAPS
50.0000 mg | ORAL_CAPSULE | Freq: Once | ORAL | Status: AC
  Administered 2015-04-06: 50 mg via ORAL
  Filled 2015-04-06: qty 2

## 2015-04-06 MED ORDER — OLANZAPINE 10 MG PO TBDP
10.0000 mg | ORAL_TABLET | Freq: Three times a day (TID) | ORAL | Status: DC | PRN
  Administered 2015-04-07: 10 mg via ORAL
  Filled 2015-04-06: qty 1

## 2015-04-06 MED ORDER — LORAZEPAM 0.5 MG PO TABS
0.5000 mg | ORAL_TABLET | Freq: Three times a day (TID) | ORAL | Status: DC | PRN
  Administered 2015-04-06: 0.5 mg via ORAL
  Filled 2015-04-06: qty 1

## 2015-04-06 NOTE — ED Notes (Signed)
Pt broke metal piece off stretcher, stretcher removed from room, mattress placed in room.  [employer] and security at bedside for assistance, difficult to redirect pt.

## 2015-04-06 NOTE — Tx Team (Signed)
Initial Interdisciplinary Treatment Plan   PATIENT STRESSORS: Substance abuse   PATIENT STRENGTHS: Ability for insight Capable of independent living Motivation for treatment/growth   PROBLEM LIST: Problem List/Patient Goals Date to be addressed Date deferred Reason deferred Estimated date of resolution  Auditory hallucinations 04/06/15     Psychosis  04/06/15                                                DISCHARGE CRITERIA:  Ability to meet basic life and health needs Adequate post-discharge living arrangements Improved stabilization in mood, thinking, and/or behavior  PRELIMINARY DISCHARGE PLAN: Attend aftercare/continuing care group Attend PHP/IOP  PATIENT/FAMIILY INVOLVEMENT: This treatment plan has been presented to and reviewed with the patient, Lance Nguyen, and/or family member.  The patient and family have been given the opportunity to ask questions and make suggestions.  Lance Nguyen 04/06/2015, 6:24 PM

## 2015-04-06 NOTE — ED Notes (Signed)
1-1 sitter with pt. Security and [employer] at bedside for assistance, pt remains resistant to care and redirection.

## 2015-04-06 NOTE — Progress Notes (Signed)
19 yr old male previously with medicaid, no pcp, no insurance listed  Dx Psychoactive substance-induced mood disorder CM spoke with pt but pt noted to mumble and Cm unable to understand what he was talking about Cm discussed pcps Pt shook his head "No" CM briefly discuss importance of pcp f/u and left pt with self pay [place] resources CM  provided written information for self pay pcps, discussed the importance of pcp vs EDP services for f/u care, www.needymeds.org, www.goodrx.com, discounted pharmacies and other [place] resources such as [facility] , [facility], affordable care act,  [place] med assist, financial assistance, self pay dental services, [place] med assist, DSS and  health department  Reviewed resources for [place] self pay pcps like [facility], [facility] [address], [facility], [facility], local urgent care centers, [facility], [facility], general medical clinics, [facility], [facility] urgent care plus others, medication resources, [facility] out [facility]ies and housing Pt voiced understanding and appreciation of resources provided   Provided [facility] contact information

## 2015-04-06 NOTE — Consult Note (Signed)
[facility]   Reason for Consult:  Substance induced psychosis Referring Physician:  EDP Patient Identification: Lance Nguyen MRN:  [id number] Principal Diagnosis: Psychoactive substance-induced mood disorder Diagnosis:   Patient Active Problem List   Diagnosis Date Noted  . 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 Hallucinations [R44.3]     Total Time spent with patient: 45 minutes  Subjective:   Lance Nguyen is a 19 y.o. male patient will be re-evaluated in am once his drugs have cleared.  HPI:  The patient presented to the ED with bizarre behaviors.  He was praying, delusional, and unstable.  His parents report he took a pill that he did not know and started acting strangely at the house.  The behaviors increased and they brought him to the ED.  He has a past history of substance abuse and is with [facility].  This afternoon, he started sweating and could not sit still--PRN Benadryl and Ativan IM were given.  He is much calmer, clearer, and coherent.  Reviewed above note with updates.   Patient was not able to participate in the interview today.  Patient was not able to speak or explain what happened to him except relaying to his mother that he took a pill. Patient then would not speak further.  Patient was drowsy and wanted to sleep.  Mother, who was in the room informed providers that he was doing well until 3 of his friends came to visit him Tuesday evening.  After that visit patient woke up 3 am Wednesday but could not get back to sleep.  [place] by Police.  He was then brought in the hospital.  We will seek inpatient hospitalization while we continue to monitor patient. His head CT scan was negative.  Mother denies previous Psychiatric illness for patient.  HPI Elements:   Location:  generalized. Quality:  acute. Severity:  moderate. Timing:  constant. Duration:  since  yesterday. Context:  drug abuse.  Past Medical History: History reviewed. No pertinent past medical history. History reviewed. No pertinent past surgical history. Family History: History reviewed. No pertinent family history. Social History:  History  Alcohol Use No     History  Drug Use No    History   Social History  . Marital Status: Single    Spouse Name: N/A  . Number of Children: N/A  . Years of Education: N/A   Social History Main Topics  . Smoking status: Never Smoker   . Smokeless tobacco: Not on file  . Alcohol Use: No  . Drug Use: No  . Sexual Activity: Not on file   Other Topics Concern  . None   Social History Narrative   Additional Social History:    Pain Medications: Pt unable to tell Prescriptions:  Pt unable to tell Over the Counter: Pt unable to tell History of alcohol / drug use?: Yes Name of Substance 1: Marijuana 1 - Age of First Use: Teens 1 - Amount (size/oz): Unknown 1 - Frequency: Unable to assess 1 - Duration: Unable to assess. 1 - Last Use / Amount: Unknown                   Allergies:  No Known Allergies  Labs:  Results for orders placed or performed during the hospital encounter of 04/04/15 (from the past 48 hour(s))  Acetaminophen level     Status: Abnormal   Collection Time: 04/04/15  6:04  PM  Result Value Ref Range   Acetaminophen (Tylenol), Serum <10 (L) 10 - 30 ug/mL    Comment:        THERAPEUTIC CONCENTRATIONS VARY SIGNIFICANTLY. A RANGE OF 10-30 ug/mL MAY BE AN EFFECTIVE CONCENTRATION FOR MANY PATIENTS. HOWEVER, SOME ARE BEST TREATED AT CONCENTRATIONS OUTSIDE THIS RANGE. ACETAMINOPHEN CONCENTRATIONS >150 ug/mL AT 4 HOURS AFTER INGESTION AND >50 ug/mL AT 12 HOURS AFTER INGESTION ARE OFTEN ASSOCIATED WITH TOXIC REACTIONS.   CBC     Status: Abnormal   Collection Time: 04/04/15  6:04 PM  Result Value Ref Range   WBC 15.8 (H) 4.0 - 10.5 K/uL   RBC 4.96 4.22 - 5.81 MIL/uL   Hemoglobin 15.5 13.0 - 17.0 g/dL    HCT 44.3 39.0 - 52.0 %   MCV 89.3 78.0 - 100.0 fL   MCH 31.3 26.0 - 34.0 pg   MCHC 35.0 30.0 - 36.0 g/dL   RDW 12.0 11.5 - 15.5 %   Platelets 236 150 - 400 K/uL  Comprehensive metabolic panel     Status: Abnormal   Collection Time: 04/04/15  6:04 PM  Result Value Ref Range   Sodium 138 135 - 145 mmol/L   Potassium 4.0 3.5 - 5.1 mmol/L   Chloride 100 (L) 101 - 111 mmol/L   CO2 25 22 - 32 mmol/L   Glucose, Bld 145 (H) 65 - 99 mg/dL   BUN 20 6 - 20 mg/dL   Creatinine, Ser 1.16 0.61 - 1.24 mg/dL   Calcium 9.9 8.9 - 10.3 mg/dL   Total Protein 8.4 (H) 6.5 - 8.1 g/dL   Albumin 5.2 (H) 3.5 - 5.0 g/dL   AST 25 15 - 41 U/L   ALT 16 (L) 17 - 63 U/L   Alkaline Phosphatase 62 38 - 126 U/L   Total Bilirubin 0.8 0.3 - 1.2 mg/dL   GFR calc non Af Amer >60 >60 mL/min   GFR calc Af Amer >60 >60 mL/min    Comment: (NOTE) The eGFR has been calculated using the CKD EPI equation. This calculation has not been validated in all clinical situations. eGFR's persistently <60 mL/min signify possible Chronic Kidney Disease.    Anion gap 13 5 - 15  Ethanol (ETOH)     Status: None   Collection Time: 04/04/15  6:04 PM  Result Value Ref Range   Alcohol, Ethyl (B) <5 <5 mg/dL    Comment:        LOWEST DETECTABLE LIMIT FOR SERUM ALCOHOL IS 11 mg/dL FOR MEDICAL PURPOSES ONLY   Salicylate level     Status: None   Collection Time: 04/04/15  6:04 PM  Result Value Ref Range   Salicylate Lvl <6.4 2.8 - 30.0 mg/dL  Urine Drug Screen     Status: Abnormal   Collection Time: 04/04/15  6:28 PM  Result Value Ref Range   Opiates NONE DETECTED NONE DETECTED   Cocaine NONE DETECTED NONE DETECTED   Benzodiazepines NONE DETECTED NONE DETECTED   Amphetamines NONE DETECTED NONE DETECTED   Tetrahydrocannabinol POSITIVE (A) NONE DETECTED   Barbiturates NONE DETECTED NONE DETECTED    Comment:        DRUG SCREEN FOR MEDICAL PURPOSES ONLY.  IF CONFIRMATION IS NEEDED FOR ANY PURPOSE, NOTIFY LAB WITHIN 5 DAYS.         LOWEST DETECTABLE LIMITS FOR URINE DRUG SCREEN Drug Class       Cutoff (ng/mL) Amphetamine      1000 Barbiturate  200 Benzodiazepine   867 Tricyclics       672 Opiates          300 Cocaine          300 THC              50     Vitals: Blood pressure 122/74, pulse 62, temperature 98.2 F (36.8 C), temperature source Oral, resp. rate 18, SpO2 100 %.  Risk to Self: Suicidal Ideation: No Suicidal Intent: No Is patient at risk for suicide?: No Suicidal Plan?: No Access to Means: No What has been your use of drugs/alcohol within the last 12 months?: Pills & THC How many times?: 0 Other Self Harm Risks: Unknown Triggers for Past Attempts: None known Intentional Self Injurious Behavior: None Risk to Others: Homicidal Ideation: No Thoughts of Harm to Others: No Current Homicidal Intent: No Current Homicidal Plan: No Access to Homicidal Means: No Identified Victim: No one History of harm to others?: No Assessment of Violence: None Noted Violent Behavior Description: Unknown Does patient have access to weapons?: No Criminal Charges Pending?: Yes Describe Pending Criminal Charges: Pt could not say Does patient have a court date: Yes Court Date: 04/07/15 (Pt unsure but mentioned 05/27) Prior Inpatient Therapy: Prior Inpatient Therapy: No Prior Therapy Dates: Unknown Prior Therapy Facilty/Provider(s): Unknown Reason for Treatment: Unknown Prior Outpatient Therapy: Prior Outpatient Therapy: No Prior Therapy Dates: Unknown Prior Therapy Facilty/Provider(s): Unknown Reason for Treatment: Unknown Does patient have an ACCT team?: Unknown Does patient have Intensive In-House Services?  : Unknown Does patient have [facility] services? : Unknown Does patient have [facility] services?: Unknown  Current Facility-Administered Medications  Medication Dose Route Frequency Provider Last Rate Last Dose  . acetaminophen (TYLENOL) tablet 650 mg  650 mg Oral Q4H PRN Ernestina Patches, MD      .  alum & mag hydroxide-simeth (MAALOX/MYLANTA) 200-200-20 MG/5ML suspension 30 mL  30 mL Oral PRN Ernestina Patches, MD      . LORazepam (ATIVAN) tablet 0.5 mg  0.5 mg Oral Q8H PRN Delfin Gant, NP      . ondansetron (ZOFRAN) tablet 4 mg  4 mg Oral Q8H PRN Ernestina Patches, MD       Current Outpatient Prescriptions  Medication Sig Dispense Refill  . methocarbamol (ROBAXIN) 500 MG tablet Take 1 tablet (500 mg total) by mouth every 8 (eight) hours as needed for muscle spasms. (Patient not taking: Reported on 04/04/2015) 15 tablet 0    Musculoskeletal: Strength & Muscle Tone: within normal limits Gait & Station: normal Patient leans: N/A  Psychiatric Specialty Exam: Physical Exam  ROS  Blood pressure 122/74, pulse 62, temperature 98.2 F (36.8 C), temperature source Oral, resp. rate 18, SpO2 100 %.There is no height or weight on file to calculate BMI.  General Appearance: Casual  Eye Contact::  Minimal  Speech:  Normal Rate  Volume:  Normal  Mood:  Anxious  Affect:  Blunt  Thought Process:  Disorganized  Orientation:  Other:  self  Thought Content:  Delusions  Suicidal Thoughts:  No  Homicidal Thoughts:  No  Memory:  Immediate;   Poor Recent;   Poor Remote;   Poor  Judgement:  Impaired  Insight:  Lacking  Psychomotor Activity:  Increased  Concentration:  Poor  Recall:  Poor  Fund of Knowledge:Fair  Language: Fair  Akathisia:  No  Handed:  Right  AIMS (if indicated):     Assets:  Housing Leisure Time Physical Health Resilience Social Support  ADL's:  Intact  Cognition: WNL  Sleep:      Medical Decision Making: Review of Psycho-Social Stressors (1), Review or order clinical lab tests (1) and Review of Medication Regimen & Side Effects (2)  Treatment Plan Summary: Daily contact with patient to assess and evaluate symptoms and progress in treatment, Medication management and Plan re-evaluate in the am for stability  Plan:  Supportive therapy provided about ongoing  stressors. Disposition: Accepted for admission and will be seeking placement.  We will be using Ativan as needed for anxiety and agitation.  Delfin Gant, PMHNP-BC 04/06/2015 2:52 PM Patient seen face-to-face for psychiatric evaluation, chart reviewed and case discussed with the physician extender and developed treatment plan. Reviewed the information documented and agree with the treatment plan. Corena Pilgrim, MD

## 2015-04-06 NOTE — Progress Notes (Signed)
D    Pt has no sign or symptoms of EPS   He is disorganized and responding to internal stimuli   He needs a lot of redirection but responds well to same   He was becoming increasingly agitated and it was hard for him to sit still to charge his ankle bracelet   A    Verbal support and redirection given   Medications as ordered and monitor for effectiveness   Q 15 min checks   Reorient to reality as needed  R   Pt sittling in the dayroom charging his ankle bracelet eating snacks and watching TV   He is also fidgety
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
## 2015-04-06 NOTE — Progress Notes (Signed)
Admission note: Pt presents with mild confusion and difficulty processing information. Pt has delayed responses and slurred speech. Pt was able to answer initial admission questions. During admission process, pt c/o muscle spasms in his chest and legs. Pt was then brought onto the unit. While in his room, pt immediately developed dystonia. Pt upper extremities were contracted and pt body was shaking. Pt was noted to be posturing and could not sit down d/t severe muscle contractions. Writer called AC and notified laura, NP. Pt was given 2 mg of cogentin and 0.5 mg of Ativan. After 10 minutes, pt muscle contractions decreased. Pt noted to be resting in bed w/o any dystonia at this time.

## 2015-04-06 NOTE — BH Assessment (Addendum)
[facility] Assessment Progress Note  Per Thedore MinsMojeed Akintayo, MD, this pt requires psychiatric hospitalization at this time.  Thurman CoyerEric Kaplan, RN, [facility]  Pt is under IVC initiated by EDP Tomasita CrumbleAdeleke Oni, MD.  Pt has signed Consent to Release Information, and this [facility].  Pt's nurse has been notified, and agrees to send original paperwork along with pt via [employer], and to call report to [phone].  Doylene Canninghomas Simon Aaberg, MA Triage Specialist [phone]

## 2015-04-06 NOTE — ED Notes (Signed)
EKG obtained, Dr Rhunette CroftNanavati at bedside to eval pt.  Pt continues to be un redirectable, Security at bedside. Staff at bedside.

## 2015-04-06 NOTE — Progress Notes (Signed)
[facility] CSW obtained auth# [id number]. [facility]. CSW completed phone demographics with Lamar LaundrySonya.   Olga CoasterKristen Adhrit Krenz, LCSW  Clinical Social Work  [facility] [phone]

## 2015-04-06 NOTE — Progress Notes (Signed)
Patient did not attend the evening karaoke group.  Pt remained on unit charging his ankle bracelet during group time.

## 2015-04-06 NOTE — ED Notes (Addendum)
Spoke with Dr Rhunette CroftNanavati, as per MD to give Geodon 20 mg IM.  Pt continues to hallucinate, picking at the floor, turning over the mat, looking for snakes, talking on phone that isn't there, rambling speech.  Pt attempting to exit out doors. [employer] and security at bedside for assistance.

## 2015-04-06 NOTE — ED Notes (Signed)
Pt sleeping at present, 1-1 sitter at bedside, spontaneous respirations, even & unlabored, skin color good, no distress noted.

## 2015-04-06 NOTE — ED Notes (Signed)
Pt remains psychotic, pulling at cabinet door,attempting to pull counter top from wall.  PA Spencer notified.

## 2015-04-06 NOTE — ED Notes (Signed)
Pt. Much more alert, oriented to person and place.  Gate steady and speech clear.  Able to carry on short conversations.

## 2015-04-07 DIAGNOSIS — F1994 Other psychoactive substance use, unspecified with psychoactive substance-induced mood disorder: Secondary | ICD-10-CM

## 2015-04-07 MED ORDER — CHLORPROMAZINE HCL 25 MG PO TABS
25.0000 mg | ORAL_TABLET | Freq: Three times a day (TID) | ORAL | Status: DC
  Administered 2015-04-07: 25 mg via ORAL
  Filled 2015-04-07 (×5): qty 1

## 2015-04-07 MED ORDER — OLANZAPINE 5 MG PO TBDP
5.0000 mg | ORAL_TABLET | Freq: Three times a day (TID) | ORAL | Status: DC
  Filled 2015-04-07 (×5): qty 1

## 2015-04-07 MED ORDER — DIPHENHYDRAMINE HCL 25 MG PO CAPS
25.0000 mg | ORAL_CAPSULE | Freq: Three times a day (TID) | ORAL | Status: DC
  Administered 2015-04-07: 25 mg via ORAL
  Filled 2015-04-07 (×5): qty 1

## 2015-04-07 MED ORDER — LORAZEPAM 2 MG/ML IJ SOLN
2.0000 mg | Freq: Once | INTRAMUSCULAR | Status: AC
Start: 2015-04-07 — End: 2015-04-07
  Administered 2015-04-07: 2 mg via INTRAMUSCULAR

## 2015-04-07 MED ORDER — ALUM & MAG HYDROXIDE-SIMETH 200-200-20 MG/5ML PO SUSP
30.0000 mL | ORAL | Status: DC | PRN
  Filled 2015-04-07: qty 30

## 2015-04-07 MED ORDER — LORAZEPAM 2 MG/ML IJ SOLN
1.0000 mg | Freq: Three times a day (TID) | INTRAMUSCULAR | Status: DC
  Filled 2015-04-07 (×2): qty 1

## 2015-04-07 MED ORDER — LORAZEPAM 2 MG/ML IJ SOLN: 1.0000 mg | Freq: Two times a day (BID) | INTRAMUSCULAR | Status: DC | PRN

## 2015-04-07 MED ORDER — LORAZEPAM 2 MG/ML IJ SOLN: 1.0000 mg | INTRAMUSCULAR | Status: DC | PRN

## 2015-04-07 MED ORDER — ZIPRASIDONE MESYLATE 20 MG IM SOLR
20.0000 mg | Freq: Once | INTRAMUSCULAR | Status: AC
  Administered 2015-04-07: 20 mg via INTRAMUSCULAR
  Filled 2015-04-07 (×2): qty 20

## 2015-04-07 MED ORDER — LORAZEPAM 1 MG PO TABS
1.0000 mg | ORAL_TABLET | Freq: Three times a day (TID) | ORAL | Status: DC
  Administered 2015-04-07: 1 mg via ORAL
  Filled 2015-04-07: qty 1

## 2015-04-07 MED ORDER — CHLORPROMAZINE HCL 25 MG PO TABS: 50.0000 mg | ORAL_TABLET | Freq: Two times a day (BID) | ORAL | Status: DC | PRN

## 2015-04-07 MED ORDER — LORAZEPAM 2 MG/ML IJ SOLN
INTRAMUSCULAR | Status: AC
  Filled 2015-04-07: qty 1

## 2015-04-07 MED ORDER — BENZTROPINE MESYLATE 1 MG/ML IJ SOLN
2.0000 mg | Freq: Once | INTRAMUSCULAR | Status: AC
  Administered 2015-04-07: 2 mg via INTRAMUSCULAR
  Filled 2015-04-07 (×2): qty 2

## 2015-04-07 MED ORDER — DIPHENHYDRAMINE HCL 50 MG/ML IJ SOLN
25.0000 mg | Freq: Three times a day (TID) | INTRAMUSCULAR | Status: DC
  Filled 2015-04-07 (×4): qty 0.5
  Filled 2015-04-07: qty 1

## 2015-04-07 MED ORDER — OLANZAPINE 5 MG PO TBDP
5.0000 mg | ORAL_TABLET | ORAL | Status: AC
  Administered 2015-04-07: 5 mg via ORAL
  Filled 2015-04-07: qty 1

## 2015-04-07 MED ORDER — ZIPRASIDONE MESYLATE 20 MG IM SOLR
20.0000 mg | Freq: Once | INTRAMUSCULAR | Status: DC
  Filled 2015-04-07: qty 20

## 2015-04-07 MED ORDER — DIPHENHYDRAMINE HCL 50 MG/ML IJ SOLN
50.0000 mg | Freq: Once | INTRAMUSCULAR | Status: AC
  Administered 2015-04-07: 50 mg via INTRAMUSCULAR
  Filled 2015-04-07 (×2): qty 1

## 2015-04-07 MED ORDER — OLANZAPINE 5 MG PO TBDP
ORAL_TABLET | ORAL | Status: AC
  Filled 2015-04-07: qty 1

## 2015-04-07 MED ORDER — PROPRANOLOL HCL 20 MG PO TABS
20.0000 mg | ORAL_TABLET | Freq: Two times a day (BID) | ORAL | Status: DC
  Filled 2015-04-07: qty 1

## 2015-04-07 MED ORDER — LORAZEPAM 1 MG PO TABS: 1.0000 mg | ORAL_TABLET | ORAL | Status: DC | PRN

## 2015-04-07 MED ORDER — CHLORPROMAZINE HCL 25 MG/ML IJ SOLN
25.0000 mg | Freq: Three times a day (TID) | INTRAMUSCULAR | Status: DC
  Filled 2015-04-07 (×5): qty 1

## 2015-04-07 MED ORDER — ONDANSETRON HCL 4 MG PO TABS
4.0000 mg | ORAL_TABLET | Freq: Three times a day (TID) | ORAL | Status: DC | PRN
  Filled 2015-04-07: qty 1

## 2015-04-07 MED ORDER — LORAZEPAM 1 MG PO TABS
1.0000 mg | ORAL_TABLET | ORAL | Status: DC | PRN
  Administered 2015-04-07: 1 mg via ORAL
  Filled 2015-04-07 (×2): qty 1

## 2015-04-07 MED ORDER — LORAZEPAM 1 MG PO TABS: 1.0000 mg | ORAL_TABLET | Freq: Two times a day (BID) | ORAL | Status: DC | PRN

## 2015-04-07 MED ORDER — ZIPRASIDONE MESYLATE 20 MG IM SOLR
INTRAMUSCULAR | Status: AC
  Administered 2015-04-07: 20 mg via INTRAMUSCULAR
  Filled 2015-04-07: qty 20

## 2015-04-07 MED ORDER — ACETAMINOPHEN 325 MG PO TABS
650.0000 mg | ORAL_TABLET | ORAL | Status: DC | PRN
  Administered 2015-04-08: 650 mg via ORAL
  Filled 2015-04-07 (×2): qty 2

## 2015-04-07 MED ORDER — CHLORPROMAZINE HCL 25 MG/ML IJ SOLN: 50.0000 mg | Freq: Two times a day (BID) | INTRAMUSCULAR | Status: DC | PRN

## 2015-04-07 NOTE — Progress Notes (Signed)
RN 1:1 NOTE  D: Patient is disorganized   A: Continue 1:1 observation at this time, continue redirection; encourage food and fluids and rest  R: Patient is in the quiet room with the door closed; patient is banging the door; patient is visual hallucinating and talking to the wall and pushing at the wall; patient is still agitated and yelling to be let out

## 2015-04-07 NOTE — Progress Notes (Signed)
  D: It is hard to follow patient; patient is disorganized  A: Continue 1:1 observation; continue redirection; patient encouraged to drink fluids; will monitor vital signs  R: Patient is very disorganized;patient is reaching for things in the air; patient having visual hallucinations and responding; patient preoccupied with rolling a blunt ; patient will continue to be monitor
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
## 2015-04-07 NOTE — Progress Notes (Signed)
Pt remains very psychotic and difficult to redirect  Pt has not had an effective reaction to the Geodon and seems to have made him worse

## 2015-04-07 NOTE — Progress Notes (Addendum)
1:1 NURSING NOTE:       Patient remains on 1:1 for safety of self and others. Patient continues to present with AH/VH, responding to unseen people and orders. Pacing, hitting and pulling on the wall, occasional yelling and shouting. At times, patient will try to escape by pushing the staff away; verbally redirectable with much encouragement. Patient refused to eat, drink, sit or lay down. Patient throws, punches and ripped the pillow; was taken away form him. Blood noted coming out patient's nose; patient would not stay still for the nurse to assess him. Was handed a face towel and encouraged to wipe it off  and stop blowing his nose. Eventually, bleeding stopped.  A: Staff continue to offer food/fluid every 15 minutes. Support and encouragement offered. Safety maintained at all times. Will continue to monitor patient for safety and stability. R: Patient remains safe.

## 2015-04-07 NOTE — Progress Notes (Signed)
Pt extremely agitated and psychotic   He took the bed in the quiet room and turned it long ways up the wall   h was trying to baracade himself in the room   He needs constant redirection    Notified provider on call and received orders for geodon and benedryl  Same administered see mar for exact time and injection site

## 2015-04-07 NOTE — Progress Notes (Addendum)
Pt has continued to be agitated and hallucinating  He is very paranoid   He is having auditory visual and tactile hallucinations     Pt running down the hall running in other pts room  Pt fell as he was running down the hall   Notified provider on call and she put pt on a 1:1 for safety     attempted to examine pt for fall and do vitals but he is too agitated at present

## 2015-04-07 NOTE — Progress Notes (Signed)
1:1/Seclusion Note: D: Patient remains on 1;1/seclusion for self safety and others. Patient continues to hit and kick the door and wall, shouting and yelling, responding to internal stimuli. Patient attempt climbing the wall and catching things on air.  A: Criteria for release explain to patient. Will continue to monitor patient for safety and stability.  R: Criteria for release not met at this time.
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
## 2015-04-07 NOTE — Progress Notes (Signed)
Pt told staff that a girl was sitting on the bed next to him and a nurse came in and checked her armband and gave her some medication  He said the girl was in his bathroom at present   Pt is having auditory and visual hallucinations

## 2015-04-07 NOTE — Progress Notes (Signed)
RN 1:1 NOTE  D: Patient is disorganized   A: Continue 1:1 observation at this time, continue redirection; encourage food and fluids and rest  R: Patient is eating minimally; patient is hallucinations; patient trying to talk to the wall and open the wall as if it is a door; patient is tangential and not always responding to questions; patient requiring frequent redirection

## 2015-04-07 NOTE — Progress Notes (Signed)
Patient lacks capacity to participate in disposition planning.   Mikahla Wisor ,MD [facility]        
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
## 2015-04-07 NOTE — Progress Notes (Signed)
Pt was climbing on the bed trying to climb up the walls  Very psychotic and paranoid   Pt was trying to jump,over staff and it was felt that pt was going to hurt himself and was put in a prt to the floor for less than a minute and then was allowed to get up and go to the blue quiet room   Pt continues to be a 1:1 for safety and impulsive and psychotic behaviors   He remains safe but continues to be difficult to redirect and very psychotic   It is taking 2 to 3 staff to redirect him

## 2015-04-07 NOTE — [facility] Suicide Risk Assessment (Signed)
[facility]   Nursing information obtained from:  Patient Demographic factors:  Male, Adolescent or young adult Current Mental Status:  NA Loss Factors:  NA Historical Factors:  NA Risk Reduction Factors:  Sense of responsibility to family, Employed, Living with another person, especially a relative, Positive social support Total Time spent with patient: 45 minutes Principal Problem: Substance  Induced Psychosis  Patient Active Problem List   Diagnosis Date Noted  . Psychosis [F29] 04/06/2015  . Altered mental status [R41.82]   . 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 Hallucinations [R44.3]      Continued Clinical Symptoms:  Alcohol Use Disorder Identification Test Final Score (AUDIT): 0 The "Alcohol Use Disorders Identification Test", Guidelines for Use in Primary Care, Second Edition.  World Science writerHealth Organization [facility](WHO). Score between 0-7:  no or low risk or alcohol related problems. Score between 8-15:  moderate risk of alcohol related problems. Score between 16-19:  high risk of alcohol related problems. Score 20 or above:  warrants further diagnostic evaluation for alcohol dependence and treatment.   CLINICAL FACTORS:  Patient is a 19 year old male, no known prior psychiatric history other than cannabis abuse, who presents with sudden onset of acute agitation, bizarre , disorganized behavior, confusion, hallucinations. Onset was sudden, started 2 days ago, and followed ingestion of unknown drug, reportedly provided by a friend.    Musculoskeletal: Strength & Muscle Tone: within normal limits Gait & Station: normal Patient leans: N/A  Psychiatric Specialty Exam: Physical Exam  ROS  Blood pressure 139/92, pulse 82, temperature 98.5 F (36.9 C), temperature source Oral, resp. rate 18, height 5\' 11"  (1.803 m), weight 150 lb (68.04 kg), SpO2 98 %.Body mass index is 20.93 kg/(m^2).  See admit note MSE                                                         COGNITIVE FEATURES THAT CONTRIBUTE TO RISK:  Loss of executive function    SUICIDE RISK:   Moderate:  Frequent suicidal ideation with limited intensity, and duration, some specificity in terms of plans, no associated intent, good self-control, limited dysphoria/symptomatology, some risk factors present, and identifiable protective factors, including available and accessible social support.  PLAN OF CARE: Patient will be admitted to inpatient psychiatric unit for stabilization and safety. Will provide and encourage milieu participation. Provide medication management and maked adjustments as needed.  Will follow daily.    Medical Decision Making:  Review of Psycho-Social Stressors (1), Review or order clinical lab tests (1), Established Problem, Worsening (2) and Review of Medication Regimen & Side Effects (2)  I certify that inpatient services furnished can reasonably be expected to improve the patient's condition.   COBOS, FERNANDO 04/07/2015, 1:02 PM

## 2015-04-07 NOTE — Progress Notes (Addendum)
1:1 Seclusion Note  D: Patient is A&O to self only, in quiet room with door closed, presenting with auditory and visual hallucinations. Patient is pacing around the room talking to people who aren't there, banging on the door at times, walking into the walls, banging on the walls, picking things up that aren't there, and yelling out at times. Dr. Jama Flavorsobos saw the patient. Meds given as ordered. A: Foods and fluids have been encouraged. Continue monitoring 1:1 in the quiet room with the door closed. Continue redirection.  R: Patient continues to need constant redirection, Patient took medications as ordered. Patient and others remain safe.   Truett Mcfarlan, Wyman SongsterAngela Marie, RN

## 2015-04-07 NOTE — Progress Notes (Signed)
D: Patient is disorganized   A: Continue 1:1 observation at this time, continue redirection; encourage food and fluids and rest  R: Mom came to visit patient per suggestion from Dr. Jama Flavorsobos; mom was able to get the patient to eat a few bites and a few sips of his drink; patient became agitated and swung a staff member and a CIRT was called at 1130 hrs

## 2015-04-07 NOTE — [facility] Group Notes (Signed)
[facility]   04/07/2015 11:24 AM  Participation Quality:  Floridly psychotic.  Did not attend    Lance Nguyen, Lance Nguyen

## 2015-04-07 NOTE — Progress Notes (Signed)
Was the fall witnessed: yes by staff  Paulino RilyAshley rn  Patient condition before and after the fall: pt is psychotic and impulsive he ran out of the quiet room down the hall and fell sliding on his stomach   Pt remains psychotic and difficult to redirect  Patient's reaction to the fall: pt just got up and kept going   He would not allow staff to examine him or do vitals  Name of the doctor that was notified including date and time: Ijeoma notified around 410 am 5/27  Any interventions and vital signs: refused interventions and vitals

## 2015-04-07 NOTE — Progress Notes (Signed)
Patient pulling on electrical socket, refused verbal redirection. Patient became agitated, hitting and kicking the 1:1 staff trying to escape the room. Staff put hands on patient safely to seclusion room. Patient continues to hit the door and wall. Will continue to monitor patient for safety and stability.

## 2015-04-07 NOTE — Progress Notes (Signed)
D: Patient is disorganized   A: Continue 1:1 observation at this time, continue redirection; encourage food and fluids and rest  R: Patient requiring frequent redirection; patient is visually hallucinating; patient can not stay still; patient is not cooperative and refuses hygiene and toileting at this time

## 2015-04-07 NOTE — [facility] Group Notes (Signed)
[facility] LCSW Group Therapy  04/07/2015  1:05 PM  Type of Therapy:  Group therapy  Participation Level: Unable to participate.  Preoccupied.  Summary of Progress/Problems:  Chaplain was here to lead a group on themes of hope and courage.  Daryel Geraldorth, Ferris Tally B 04/07/2015 1:13 PM

## 2015-04-08 ENCOUNTER — Inpatient Hospital Stay (HOSPITAL_COMMUNITY)
Admission: EM | Admit: 2015-04-08 | Discharge: 2015-04-10 | DRG: 896 | Disposition: A | Discharge status: Home / Self Care | Payer: Self-pay | Attending: Internal Medicine | Admitting: Internal Medicine

## 2015-04-08 ENCOUNTER — Encounter (HOSPITAL_COMMUNITY): Payer: Self-pay

## 2015-04-08 ENCOUNTER — Inpatient Hospital Stay (HOSPITAL_COMMUNITY): Payer: Medicaid Other

## 2015-04-08 ENCOUNTER — Inpatient Hospital Stay (HOSPITAL_COMMUNITY): Payer: Self-pay

## 2015-04-08 ENCOUNTER — Emergency Department (HOSPITAL_COMMUNITY): Payer: Self-pay

## 2015-04-08 ENCOUNTER — Encounter (HOSPITAL_COMMUNITY): Payer: Self-pay | Admitting: Registered Nurse

## 2015-04-08 DIAGNOSIS — IMO0001 Reserved for inherently not codable concepts without codable children: Secondary | ICD-10-CM

## 2015-04-08 DIAGNOSIS — R4182 Altered mental status, unspecified: Secondary | ICD-10-CM

## 2015-04-08 DIAGNOSIS — G9340 Encephalopathy, unspecified: Secondary | ICD-10-CM | POA: Diagnosis present

## 2015-04-08 DIAGNOSIS — F1220 Cannabis dependence, uncomplicated: Secondary | ICD-10-CM | POA: Diagnosis present

## 2015-04-08 DIAGNOSIS — F10939 Alcohol use, unspecified with withdrawal, unspecified: Secondary | ICD-10-CM | POA: Insufficient documentation

## 2015-04-08 DIAGNOSIS — F13239 Sedative, hypnotic or anxiolytic dependence with withdrawal, unspecified: Secondary | ICD-10-CM

## 2015-04-08 DIAGNOSIS — F19951 Other psychoactive substance use, unspecified with psychoactive substance-induced psychotic disorder with hallucinations: Secondary | ICD-10-CM

## 2015-04-08 DIAGNOSIS — I10 Essential (primary) hypertension: Secondary | ICD-10-CM | POA: Diagnosis present

## 2015-04-08 DIAGNOSIS — F10239 Alcohol dependence with withdrawal, unspecified: Secondary | ICD-10-CM | POA: Insufficient documentation

## 2015-04-08 DIAGNOSIS — F10231 Alcohol dependence with withdrawal delirium: Principal | ICD-10-CM | POA: Diagnosis present

## 2015-04-08 DIAGNOSIS — F1310 Sedative, hypnotic or anxiolytic abuse, uncomplicated: Secondary | ICD-10-CM | POA: Diagnosis present

## 2015-04-08 DIAGNOSIS — F29 Unspecified psychosis not due to a substance or known physiological condition: Secondary | ICD-10-CM | POA: Diagnosis present

## 2015-04-08 LAB — URINE MICROSCOPIC-ADD ON

## 2015-04-08 LAB — CBC WITH DIFFERENTIAL/PLATELET
BASOS ABS: 0 10*3/uL (ref 0.0–0.1)
Basophils Absolute: 0 10*3/uL (ref 0.0–0.1)
Basophils Relative: 0 % (ref 0–1)
Basophils Relative: 0 % (ref 0–1)
EOS ABS: 0 10*3/uL (ref 0.0–0.7)
Eosinophils Absolute: 0 10*3/uL (ref 0.0–0.7)
Eosinophils Relative: 0 % (ref 0–5)
Eosinophils Relative: 0 % (ref 0–5)
HCT: 40.8 % (ref 39.0–52.0)
HCT: 36.1 % — ABNORMAL LOW (ref 39.0–52.0)
Hemoglobin: 14.4 g/dL (ref 13.0–17.0)
Hemoglobin: 12.4 g/dL — ABNORMAL LOW (ref 13.0–17.0)
LYMPHS ABS: 1.9 10*3/uL (ref 0.7–4.0)
Lymphocytes Relative: 15 % (ref 12–46)
Lymphocytes Relative: 17 % (ref 12–46)
Lymphs Abs: 2 10*3/uL (ref 0.7–4.0)
MCH: 31 pg (ref 26.0–34.0)
MCH: 31 pg (ref 26.0–34.0)
MCHC: 35.3 g/dL (ref 30.0–36.0)
MCHC: 34.3 g/dL (ref 30.0–36.0)
MCV: 87.9 fL (ref 78.0–100.0)
MCV: 90.3 fL (ref 78.0–100.0)
MONO ABS: 1.8 10*3/uL — AB (ref 0.1–1.0)
Monocytes Absolute: 1.4 10*3/uL — ABNORMAL HIGH (ref 0.1–1.0)
Monocytes Relative: 13 % — ABNORMAL HIGH (ref 3–12)
Monocytes Relative: 12 % (ref 3–12)
NEUTROS PCT: 71 % (ref 43–77)
Neutro Abs: 9.7 10*3/uL — ABNORMAL HIGH (ref 1.7–7.7)
Neutro Abs: 7.8 10*3/uL — ABNORMAL HIGH (ref 1.7–7.7)
Neutrophils Relative %: 72 % (ref 43–77)
Platelets: 225 10*3/uL (ref 150–400)
Platelets: 192 10*3/uL (ref 150–400)
RBC: 4.64 MIL/uL (ref 4.22–5.81)
RBC: 4 MIL/uL — ABNORMAL LOW (ref 4.22–5.81)
RDW: 12 % (ref 11.5–15.5)
RDW: 11.9 % (ref 11.5–15.5)
WBC: 13.5 10*3/uL — ABNORMAL HIGH (ref 4.0–10.5)
WBC: 11.1 10*3/uL — ABNORMAL HIGH (ref 4.0–10.5)

## 2015-04-08 LAB — COMPREHENSIVE METABOLIC PANEL
ALBUMIN: 4.9 g/dL (ref 3.5–5.0)
ALBUMIN: 3.8 g/dL (ref 3.5–5.0)
ALK PHOS: 60 U/L (ref 38–126)
ALT: 67 U/L — ABNORMAL HIGH (ref 17–63)
ALT: 53 U/L (ref 17–63)
ANION GAP: 11 (ref 5–15)
AST: 189 U/L — AB (ref 15–41)
AST: 150 U/L — AB (ref 15–41)
Alkaline Phosphatase: 49 U/L (ref 38–126)
Anion gap: 7 (ref 5–15)
BUN: 29 mg/dL — ABNORMAL HIGH (ref 6–20)
BUN: 23 mg/dL — ABNORMAL HIGH (ref 6–20)
CO2: 25 mmol/L (ref 22–32)
CO2: 26 mmol/L (ref 22–32)
CREATININE: 0.94 mg/dL (ref 0.61–1.24)
CREATININE: 0.83 mg/dL (ref 0.61–1.24)
Calcium: 9.5 mg/dL (ref 8.9–10.3)
Calcium: 8.4 mg/dL — ABNORMAL LOW (ref 8.9–10.3)
Chloride: 100 mmol/L — ABNORMAL LOW (ref 101–111)
Chloride: 105 mmol/L (ref 101–111)
GFR calc Af Amer: >60 mL/min (ref >60)
GFR calc Af Amer: >60 mL/min (ref >60)
GFR calc non Af Amer: >60 mL/min (ref >60)
GLUCOSE: 82 mg/dL (ref 65–99)
GLUCOSE: 79 mg/dL (ref 65–99)
Potassium: 4.1 mmol/L (ref 3.5–5.1)
Potassium: 3.5 mmol/L (ref 3.5–5.1)
SODIUM: 136 mmol/L (ref 135–145)
Sodium: 138 mmol/L (ref 135–145)
TOTAL PROTEIN: 6.2 g/dL — AB (ref 6.5–8.1)
Total Bilirubin: 1.6 mg/dL — ABNORMAL HIGH (ref 0.3–1.2)
Total Bilirubin: 1.6 mg/dL — ABNORMAL HIGH (ref 0.3–1.2)
Total Protein: 7.9 g/dL (ref 6.5–8.1)

## 2015-04-08 LAB — URINALYSIS, ROUTINE W REFLEX MICROSCOPIC
BILIRUBIN URINE: NEGATIVE
Glucose, UA: NEGATIVE mg/dL
HGB URINE DIPSTICK: NEGATIVE
Ketones, ur: NEGATIVE mg/dL
Leukocytes, UA: NEGATIVE
Nitrite: NEGATIVE
PH: 6 (ref 5.0–8.0)
Protein, ur: 30 mg/dL — AB
SPECIFIC GRAVITY, URINE: 1.037 — AB (ref 1.005–1.030)
Urobilinogen, UA: 0.2 mg/dL (ref 0.0–1.0)

## 2015-04-08 LAB — RAPID URINE DRUG SCREEN, HOSP PERFORMED
AMPHETAMINES: NOT DETECTED
BENZODIAZEPINES: POSITIVE — AB
Barbiturates: NOT DETECTED
COCAINE: NOT DETECTED
Opiates: NOT DETECTED
Tetrahydrocannabinol: POSITIVE — AB

## 2015-04-08 LAB — PROTIME-INR
INR: 1.31 (ref 0.00–1.49)
Prothrombin Time: 16.4 seconds — ABNORMAL HIGH (ref 11.6–15.2)

## 2015-04-08 LAB — MAGNESIUM: MAGNESIUM: 1.9 mg/dL (ref 1.7–2.4)

## 2015-04-08 LAB — APTT: aPTT: 32 seconds (ref 24–37)

## 2015-04-08 LAB — ETHANOL

## 2015-04-08 LAB — PHOSPHORUS: PHOSPHORUS: 3.4 mg/dL (ref 2.5–4.6)

## 2015-04-08 MED ORDER — THIAMINE HCL 100 MG/ML IJ SOLN
Freq: Every day | INTRAVENOUS | Status: DC
  Administered 2015-04-08 – 2015-04-09 (×2): via INTRAVENOUS
  Filled 2015-04-08 (×3): qty 1000

## 2015-04-08 MED ORDER — ENOXAPARIN SODIUM 30 MG/0.3ML ~~LOC~~ SOLN
30.0000 mg | Freq: Every day | SUBCUTANEOUS | Status: DC
  Administered 2015-04-09: 30 mg via SUBCUTANEOUS
  Filled 2015-04-08 (×2): qty 0.3

## 2015-04-08 MED ORDER — ADULT MULTIVITAMIN W/MINERALS CH: 1.0000 | ORAL_TABLET | Freq: Every day | ORAL | Status: DC

## 2015-04-08 MED ORDER — SODIUM CHLORIDE 0.9 % IV BOLUS (SEPSIS)
1000.0000 mL | Freq: Once | INTRAVENOUS | Status: AC
  Administered 2015-04-08: 1000 mL via INTRAVENOUS

## 2015-04-08 MED ORDER — LORAZEPAM 2 MG/ML IJ SOLN
2.0000 mg | Freq: Once | INTRAMUSCULAR | Status: AC
  Administered 2015-04-08: 2 mg via INTRAVENOUS
  Filled 2015-04-08: qty 1

## 2015-04-08 MED ORDER — DEXMEDETOMIDINE HCL IN NACL 200 MCG/50ML IV SOLN
0.4000 ug/kg/h | INTRAVENOUS | Status: AC
  Administered 2015-04-08 (×2): 0.2 ug/kg/h via INTRAVENOUS
  Administered 2015-04-09: 1 ug/kg/h via INTRAVENOUS
  Filled 2015-04-08 (×4): qty 50

## 2015-04-08 MED ORDER — LORAZEPAM 2 MG/ML IJ SOLN
1.0000 mg | INTRAMUSCULAR | Status: DC | PRN
  Administered 2015-04-08 (×3): 2 mg via INTRAVENOUS
  Administered 2015-04-09: 1 mg via INTRAVENOUS
  Filled 2015-04-08 (×5): qty 1

## 2015-04-08 NOTE — Progress Notes (Signed)
Patient alert, with seconds of lucidity, less agitated and less aggressive.  Patient able to drink and eat some breakfast.  Continues to appear to be hallucinating, talking to and about people not there, attempting to pick things off the wall.  Assessed with, small nut on forehead size of a quarter, swelling & bruising "pinky" finger of above both hands, and bruising on different on both feet.  Patient was kicking and hitting padded walls in seclusion room.  Patient was able to be redirected to sit for a few minutes at a time, able to answer some questions sometimes accurately.  Appropriate to release seclusion at time on trial.  Blood pressure and pulse elevated.  Will attempt to retake when calmer.

## 2015-04-08 NOTE — ED Notes (Signed)
Patient transported to CT 

## 2015-04-08 NOTE — ED Notes (Signed)
Transferred to ICU with Molly Maduroobert EMT and [facility]. Updated report to SunGardSol RN. CT and X-ray results pending. Pt continues to be calm and cooperative with care.

## 2015-04-08 NOTE — ED Provider Notes (Signed)
CSN: [id number]     Arrival date & time 04/08/15  [id number] History   First MD Initiated Contact with Patient 04/08/15 [phone]     No chief complaint on file.    (Consider location/radiation/quality/duration/timing/severity/associated sxs/prior Treatment) HPI  [age] year old male sent over from behavioral health due to worsening agitation and concern for alcohol withdrawal. The patient is unable to provide a history as he is acutely altered. The patient apparently came to this ER couple days ago with bizarre behavior after taking a Xanax. He was involuntarily committed and admitted to behavioral health. Overnight he's become tachycardic and more more agitated. I talked to to the mother who denies that patient drink alcohol but she and the patient's sister think that he does Xanax every day or every other day and has been doing this for several months. Patient also is known to do other drugs such as marijuana and other possible street drugs. The patient is currently actively hallucinating and is unable to follow commands.  No past medical history on file. No past surgical history on file. No family history on file. History  Substance Use Topics  . Smoking status: Never Smoker   . Smokeless tobacco: Not on file  . Alcohol Use: No    Review of Systems  Unable to perform ROS: Psychiatric disorder      Allergies  Review of patient's allergies indicates no known allergies.  Home Medications   Prior to Admission medications   Not on File   BP 116/80 mmHg  Pulse 97  Resp 22  SpO2 99% Physical Exam  Constitutional: He appears well-developed and well-nourished.  HENT:  Head: Normocephalic and atraumatic.  Right Ear: External ear normal.  Left Ear: External ear normal.  Nose: Nose normal.  Eyes: Right eye exhibits no discharge. Left eye exhibits no discharge.  Neck: Normal range of motion and full passive range of motion without pain. Neck supple. No rigidity. Normal range of motion present.   Cardiovascular: Regular rhythm, normal heart sounds and intact distal pulses.  Tachycardia present.   Pulmonary/Chest: Effort normal.  Abdominal: Soft. He exhibits no distension. There is no tenderness.  Musculoskeletal: He exhibits no edema.       Right hand: He exhibits tenderness and swelling. He exhibits no laceration.       Hands: Neurological: He is alert. He is disoriented. He displays tremor. GCS eye subscore is 4. GCS verbal subscore is 4. GCS motor subscore is 6.  Skin: Skin is warm and dry.  Psychiatric: He is agitated and actively hallucinating.  Nursing note and vitals reviewed.   ED Course  Procedures (including critical care time) Labs Review Labs Reviewed  COMPREHENSIVE METABOLIC PANEL - Abnormal; Notable for the following:    Chloride 100 (*)    BUN 29 (*)    AST 189 (*)    ALT 67 (*)    Total Bilirubin 1.6 (*)    All other components within normal limits  CBC WITH DIFFERENTIAL/PLATELET - Abnormal; Notable for the following:    WBC 13.5 (*)    Neutro Abs 9.7 (*)    Monocytes Relative 13 (*)    Monocytes Absolute 1.8 (*)    All other components within normal limits  URINALYSIS, ROUTINE W REFLEX MICROSCOPIC (NOT AT [facility]) - Abnormal; Notable for the following:    Color, Urine AMBER (*)    Specific Gravity, Urine 1.037 (*)    Protein, ur 30 (*)    All other components within normal limits  URINE  RAPID DRUG SCREEN (HOSP PERFORMED) NOT AT [facility] - Abnormal; Notable for the following:    Benzodiazepines POSITIVE (*)    Tetrahydrocannabinol POSITIVE (*)    All other components within normal limits  ETHANOL  URINE MICROSCOPIC-ADD ON  CBC WITH DIFFERENTIAL/PLATELET  COMPREHENSIVE METABOLIC PANEL  APTT  PROTIME-INR  DRUGS OF ABUSE SCREEN W ALC, ROUTINE URINE  MAGNESIUM  PHOSPHORUS    Imaging Review No results found.   EKG Interpretation None      CRITICAL CARE Performed by: Pricilla Loveless T   Total critical care time: 40 minutes  Critical care  time was exclusive of separately billable procedures and treating other patients.  Critical care was necessary to treat or prevent imminent or life-threatening deterioration.  Critical care was time spent personally by me on the following activities: development of treatment plan with patient and/or surrogate as well as nursing, discussions with consultants, evaluation of patient's response to treatment, examination of patient, obtaining history from patient or surrogate, ordering and performing treatments and interventions, ordering and review of laboratory studies, ordering and review of radiographic studies, pulse oximetry and re-evaluation of patient's condition.  MDM   Final diagnoses:  Encephalopathy acute  Benzodiazepine Withdrawal.  Patient appears to be in acute withdrawal. Per family uses xanax frequently and this is likely the cause. Low grade fever, tachycardia and hypertension with hallucinations c/w this presentation. Neck is supple and he has been agitated past few days with progressively worse behavior. I doubt meningitis given this presentation. Requiring high doses of ativan with high CIWA scores, will need admission and close monitoring in ICU. Is currently maintaining airway.    Pricilla Loveless, MD 04/08/15 [phone]

## 2015-04-08 NOTE — ED Notes (Signed)
MD at bedside. 

## 2015-04-08 NOTE — Progress Notes (Signed)
1:1/Seclusion Note: D: Patient reassessed for release, continues to kick and hitting the door/wall, yelling and responding to unseen people. Skin assessed, intact, no abnormality noted. Respiration even and unlabored.  A: Criteria for release explained to patient. Offered food/fluid - fluid accepted (drank 2 cups of Getorade). Offered to use the bathroom; at this time, patient began to kick and hit staff, yelling and threatening. Patient was escorted back to the seclusion room. Will continue to monitor patient for safety and stability.  R: Patient did not meet criteria for release at this time. Patient continue on seclusion for safety of self and others.

## 2015-04-08 NOTE — Progress Notes (Signed)
Nursing Assessment 1:1/Seclusion D: Patient continues to respond to internal stimuli, pacing, yelling and hitting the wall with his hands and head. Patient can only exhibit a minute of lucid moment and back to responding to unseen people. Bruises noted @ left index finger, fore head, right arm and both feet as a result of hanging and hitting the wall. Patient kept running from one end of the room to another through out the assessment. Vital signs as follows: 155/106, P 135, 96%. Capillary refill < 3 secs.  A: Offered food/fluid; fluid accepted. Accepted Acetaminophen 650 mg as ordered for mild pain. Patient encouraged to use the bathroom - attempted to climb the toilet seat, escorted out and urinal offered - refused. Patient encouraged to sit/lay down and have some rest. Per Christen BameIjeoma NP, Closed door seclusion discontinued at this time. Maintain close monitoring/1:1 observation for safety. R: Needs much encouragement. Patient is safe.

## 2015-04-08 NOTE — Progress Notes (Signed)
eLink Physician-Brief Progress Note Patient Name: Lessie DingsChristian V Delamar DOB: 1996/10/31 MRN: [id number]   Date of Service  04/08/2015  HPI/Events of Note  Agitated on 0.7 precedex & 2 ativan q 1h  eICU Interventions  Increase precedex to 1.2 Lovenox for DVT proph     Intervention Category Major Interventions: Delirium, psychosis, severe agitation - evaluation and management Intermediate Interventions: Best-practice therapies (e.g. DVT, beta blocker, etc.)  Lariah Fleer V. 04/08/2015, 11:37 PM

## 2015-04-08 NOTE — H&P (Signed)
PULMONARY / CRITICAL CARE MEDICINE   Name: Lance Nguyen MRN: [id number] DOB: [birth year]    ADMISSION DATE:  04/08/2015 CONSULTATION DATE:  04/08/2015  REFERRING MD :  EDP  CHIEF COMPLAINT:  Combative, AMS and alcohol or benzo withdrawal.  INITIAL PRESENTATION: 19 year old male presenting from behavioral health with hallucination and reportedly alcohol and xanax withdrawal.  The patient is actively hallucinating and unable to provide any history.  No family bedside.   STUDIES:  Head CT 5/28>>>  SIGNIFICANT EVENTS: 5/28>>> admission to [facility] with active withdrawal with hallucination.   HISTORY OF PRESENT ILLNESS:  19 year old male presenting from behavioral health with hallucination and reportedly alcohol and xanax withdrawal.  The patient is actively hallucinating and unable to provide any history.  No family bedside.    PAST MEDICAL HISTORY :   has no past medical history on file.  has no past surgical history on file. Prior to Admission medications   Not on File   No Known Allergies  FAMILY HISTORY:  has no family status information on file.  SOCIAL HISTORY:  reports that he has never smoked. He does not have any smokeless tobacco history on file. He reports that he uses illicit drugs (Marijuana). He reports that he does not drink alcohol.  REVIEW OF SYSTEMS:  Unable to obtain, patient is actively hallucinating.  SUBJECTIVE: Unable to provid.  VITAL SIGNS:  Reviewed. HEMODYNAMICS:   VENTILATOR SETTINGS:  None INTAKE / OUTPUT: No intake or output data in the 24 hours ending 04/08/15 1144  PHYSICAL EXAMINATION: General:  Actively hallucinating, in 4 point restraints.  Well nourished. Neuro:  Hallucinating in four point restraints. HEENT:  [place]/AT, PERRL, EOM-I and MMM. Cardiovascular:  RRR, Nl S1/S2, -M/R/G. Lungs:  CTA bilaterally. Abdomen:  Soft, NT, ND and +BS. Musculoskeletal:  Moving all ext spontaneously but restrained. Skin:   Intact.  LABS:  CBC  Recent Labs Lab 04/04/15 1804 04/08/15 1020  WBC 15.8* 13.5*  HGB 15.5 14.4  HCT 44.3 40.8  PLT 236 225   Coag's No results for input(s): APTT, INR in the last 168 hours. BMET  Recent Labs Lab 04/04/15 1804 04/08/15 1020  NA 138 136  K 4.0 4.1  CL 100* 100*  CO2 25 25  BUN 20 29*  CREATININE 1.16 0.94  GLUCOSE 145* 82   Electrolytes  Recent Labs Lab 04/04/15 1804 04/08/15 1020  CALCIUM 9.9 9.5   Sepsis Markers No results for input(s): LATICACIDVEN, PROCALCITON, O2SATVEN in the last 168 hours. ABG No results for input(s): PHART, PCO2ART, PO2ART in the last 168 hours. Liver Enzymes  Recent Labs Lab 04/04/15 1804 04/08/15 1020  AST 25 189*  ALT 16* 67*  ALKPHOS 62 60  BILITOT 0.8 1.6*  ALBUMIN 5.2* 4.9   Cardiac Enzymes No results for input(s): TROPONINI, PROBNP in the last 168 hours. Glucose No results for input(s): GLUCAP in the last 168 hours.  Imaging No results found.   ASSESSMENT / PLAN:  PULMONARY OETT None A: Protecting airway but sedation may result in him getting intubated due to severe agitation. P:   - Monitor for airway protection with sedation and extreme agitation.  CARDIOVASCULAR CVL None A: HTN and tachycardia due to withdrawal. P:  - Tele monitoring. - Treat withdrawal.  RENAL A:  No active issues. P:   - BMET in AM. - Replace electrolytes as indicated.  GASTROINTESTINAL A:  No active issues. P:   - NPO.  HEMATOLOGIC A:  No active issues.  P:  - CBC in AM. - Transfuse per ICU protocol.  INFECTIOUS A:  No active issues.  At risk for aspiration. P:   - Monitor WBC. - Monitor fever curve.  ENDOCRINE A:  No active issues.   P:   - Monitor.  NEUROLOGIC A:  Severe agitation, withdrawal from xanax and etoh, actively hallucinating. P:   - Ativan IV. - CIWA protocol. - Precedex drip. - Thiamine, folate, MVI. - Monitor for airway protection.  FAMILY  - Updates: No family  bedside.  TODAY'S SUMMARY: Severe DTs from etoh and benzos, will admit to ICU and place on precedex drip, may need intubation.  The patient is critically ill with multiple organ systems failure and requires high complexity decision making for assessment and support, frequent evaluation and titration of therapies, application of advanced monitoring technologies and extensive interpretation of multiple databases.   Critical Care Time devoted to patient care services described in this note is  35  Minutes. This time reflects time of care of this signee Dr Koren BoundWesam Hans Rusher. This critical care time does not reflect procedure time, or teaching time or supervisory time of PA/NP/Med student/Med Resident etc but could involve care discussion time.  Alyson ReedyWesam G. Natallie Ravenscroft, M.D. [facility] Pulmonary/Critical Care Medicine. Pager: [phone]. After hours pager: [phone].  04/08/2015, 11:44 AM

## 2015-04-08 NOTE — ED Notes (Signed)
Bed: WA10 Expected date: 04/08/15 Expected time: 9:13 AM Means of arrival: Ambulance Comments[facility]

## 2015-04-08 NOTE — ED Notes (Signed)
Pt will not let us get a blood pressure on him. He would not let us get a pulse or O2 sat either. Pt says there is someone in the room chasing him and he was trying to climb out of the bed while attempting to get a BP.

## 2015-04-08 NOTE — Progress Notes (Signed)
D) Pt in the seclusion room with staff in attendance. Pt is actively hallucinating and delusional. Unable to process and make sense of anything. Was able to take his morning medications, but only drank a few sips of water. Body is in constant motion. Picking at his skin, and the air, doing a pulling motion as if pulling a rope in. Poor eye contact. Hands are sweaty. Blood pressure was 143/114, O2 on room air, 98% pulse 114, temp 99.3. Pt had a spontaneous nose bleed 2x's with a total  amount of blood approx 3-4 Tablespoons. A) Order received from Dr.Lugo to transfer Pt to the ED for evaluation of symptoms. Pt's mother called and informed that Pt will be going to the ED in attendance with a tech.  R) EMS arrived to transport Pt to the ED. Tech is at Pt's side.
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
## 2015-04-08 NOTE — ED Notes (Signed)
Pt resting with eyes closed. Respirations even and unlabored. VSS. Pt is calm and cooperative with care. Sitter present

## 2015-04-08 NOTE — ED Notes (Signed)
Patient transported to X-ray 

## 2015-04-08 NOTE — ED Notes (Signed)
[facility] status. ? DT. Withdrawn from unknown substance. [facility]'s [facility]. Sent here for re evaluation. Pt presents in psychotic state. Per EMS. Unable to get much HX. Calm during transport. I spoke with Thayer Ohmhris [facility] RN [facility]. She stated he smoke "pot a lot" and did Xanax bar 2mg  /daily. He had been getting worse.

## 2015-04-09 DIAGNOSIS — R451 Restlessness and agitation: Secondary | ICD-10-CM

## 2015-04-09 DIAGNOSIS — G9340 Encephalopathy, unspecified: Secondary | ICD-10-CM

## 2015-04-09 DIAGNOSIS — F13231 Sedative, hypnotic or anxiolytic dependence with withdrawal delirium: Secondary | ICD-10-CM

## 2015-04-09 LAB — PHOSPHORUS: Phosphorus: 3.4 mg/dL (ref 2.5–4.6)

## 2015-04-09 LAB — BASIC METABOLIC PANEL
Anion gap: 8 (ref 5–15)
BUN: 25 mg/dL — ABNORMAL HIGH (ref 6–20)
CHLORIDE: 107 mmol/L (ref 101–111)
CO2: 22 mmol/L (ref 22–32)
Calcium: 8.5 mg/dL — ABNORMAL LOW (ref 8.9–10.3)
Creatinine, Ser: 0.85 mg/dL (ref 0.61–1.24)
GFR calc non Af Amer: >60 mL/min (ref >60)
Glucose, Bld: 75 mg/dL (ref 65–99)
Potassium: 3.9 mmol/L (ref 3.5–5.1)
Sodium: 137 mmol/L (ref 135–145)

## 2015-04-09 LAB — CBC
HEMATOCRIT: 36.3 % — AB (ref 39.0–52.0)
Hemoglobin: 12.4 g/dL — ABNORMAL LOW (ref 13.0–17.0)
MCH: 31.5 pg (ref 26.0–34.0)
MCHC: 34.2 g/dL (ref 30.0–36.0)
MCV: 92.1 fL (ref 78.0–100.0)
Platelets: 171 10*3/uL (ref 150–400)
RBC: 3.94 MIL/uL — ABNORMAL LOW (ref 4.22–5.81)
RDW: 12 % (ref 11.5–15.5)
WBC: 7.8 10*3/uL (ref 4.0–10.5)

## 2015-04-09 LAB — MAGNESIUM: Magnesium: 2.1 mg/dL (ref 1.7–2.4)

## 2015-04-09 NOTE — H&P (Signed)
PULMONARY / CRITICAL CARE MEDICINE   Name: Lance Nguyen MRN: [id number] DOB: [birth year]    ADMISSION DATE:  04/08/2015 CONSULTATION DATE:  04/08/2015  REFERRING MD :  EDP  CHIEF COMPLAINT:  Combative, AMS and alcohol or benzo withdrawal.  INITIAL PRESENTATION: 19 year old male presenting from behavioral health with hallucination and reportedly alcohol and xanax withdrawal.  The patient is actively hallucinating and unable to provide any history.  No family bedside.   STUDIES:  Head CT 5/28>>>  SIGNIFICANT EVENTS: 5/28>>> [facility] with active withdrawal with hallucination. 5/29>>> Oriented to self and place, following simple commands.  SUBJECTIVE: Intermittent confusion  VITAL SIGNS: Temp:  [97.4 F (36.3 C)-98.5 F (36.9 C)] 97.4 F (36.3 C) (05/29 0815) Pulse Rate:  [45-97] 48 (05/29 1000) Resp:  [14-24] 17 (05/29 1000) BP: (81-132)/(44-82) 130/67 mmHg (05/29 1000) SpO2:  [94 %-100 %] 94 % (05/29 1000) Weight:  [69.1 kg (152 lb 5.4 oz)-71.7 kg (158 lb 1.1 oz)] 71.7 kg (158 lb 1.1 oz) (05/29 0500)Reviewed. HEMODYNAMICS:   VENTILATOR SETTINGS:  None INTAKE / OUTPUT:  Intake/Output Summary (Last 24 hours) at 04/09/15 1112 Last data filed at 04/09/15 0700  Gross per 24 hour  Intake 2140.73 ml  Output   1275 ml  Net 865.73 ml   PHYSICAL EXAMINATION: General:  Alert and interactive, well nourished male, NAD. Neuro:  Alert and interactive, moving all ext to command. Head: [place]/AT. EENT:  PERRL, EOM-I and MMM. Cardiovascular:  RRR, Nl S1/S2, -M/R/G. Lungs:  CTA bilaterally. Abdomen:  Soft, NT, ND and +BS. Musculoskeletal:  Moving all ext spontaneously but restrained. Skin:  Intact.  LABS:  CBC  Recent Labs Lab 04/08/15 1020 04/08/15 1341 04/09/15 0414  WBC 13.5* 11.1* 7.8  HGB 14.4 12.4* 12.4*  HCT 40.8 36.1* 36.3*  PLT 225 192 171   Coag's  Recent Labs Lab 04/08/15 1341  APTT 32  INR 1.31   BMET  Recent Labs Lab 04/08/15 1020  04/08/15 1341 04/09/15 0414  NA 136 138 137  K 4.1 3.5 3.9  CL 100* 105 107  CO2 25 26 22   BUN 29* 23* 25*  CREATININE 0.94 0.83 0.85  GLUCOSE 82 79 75   Electrolytes  Recent Labs Lab 04/08/15 1020 04/08/15 1341 04/09/15 0414  CALCIUM 9.5 8.4* 8.5*  MG  --  1.9 2.1  PHOS  --  3.4 3.4   Sepsis Markers No results for input(s): LATICACIDVEN, PROCALCITON, O2SATVEN in the last 168 hours. ABG No results for input(s): PHART, PCO2ART, PO2ART in the last 168 hours. Liver Enzymes  Recent Labs Lab 04/04/15 1804 04/08/15 1020 04/08/15 1341  AST 25 189* 150*  ALT 16* 67* 53  ALKPHOS 62 60 49  BILITOT 0.8 1.6* 1.6*  ALBUMIN 5.2* 4.9 3.8   Cardiac Enzymes No results for input(s): TROPONINI, PROBNP in the last 168 hours. Glucose No results for input(s): GLUCAP in the last 168 hours.  Imaging Ct Head Wo Contrast  04/08/2015   CLINICAL DATA:  Altered mental status. Question DT. Psychotic state.  EXAM: CT HEAD WITHOUT CONTRAST  TECHNIQUE: Contiguous axial images were obtained from the base of the skull through the vertex without intravenous contrast.  COMPARISON:  04/04/2015 and 03/23/2015  FINDINGS: Ventricles, cisterns and other CSF spaces are normal. There is no mass, mass effect, shift of midline structures or acute hemorrhage. There is no evidence acute infarction. Remaining bones and soft tissues are within normal.  IMPRESSION: Normal head CT.   Electronically Signed  By: Elberta Fortis M.D.   On: 04/08/2015 13:12   Dg Chest Port 1 View  04/08/2015   CLINICAL DATA:  Mental status changes and withdrawal symptoms.  EXAM: PORTABLE CHEST - 1 VIEW  COMPARISON:  03/23/2015  FINDINGS: Mild right-sided perihilar density likely relates to atelectasis. Subtle infiltrate cannot be excluded. No edema, pneumothorax or pleural fluid is identified. The heart size and mediastinal contours are normal.  IMPRESSION: Mild right-sided perihilar density likely relates to atelectasis. Subtle infiltrate  is not excluded.   Electronically Signed   By: Irish Lack M.D.   On: 04/08/2015 12:25   Dg Hand Complete Right  04/08/2015   CLINICAL DATA:  Swelling and redness noted to 3rd-5th metacarpals of right hand. Unsure as to history. Questionable dog bite. Pt partially sedated. Psychotic behavior.  EXAM: RIGHT HAND - COMPLETE 3+ VIEW  COMPARISON:  None.  FINDINGS: There is no evidence of fracture or dislocation. There is no evidence of arthropathy or other focal bone abnormality. Soft tissues are unremarkable.  IMPRESSION: Negative.   Electronically Signed   By: Amie Portland M.D.   On: 04/08/2015 13:18   I reviewed CXR myself, no edema, mild right sided atelectasis.  ASSESSMENT / PLAN:  PULMONARY OETT None A: Protecting airway but sedation may result in him getting intubated due to severe agitation. P:   - D/C [place] O2.  CARDIOVASCULAR CVL None A: HTN and tachycardia due to withdrawal, now much improved. P:  - Tele monitoring. - Treat withdrawal as below.  RENAL A:  No active issues. P:   - BMET in AM. - Replace electrolytes as indicated.  GASTROINTESTINAL A:  No active issues. P:   - Regular diet.  HEMATOLOGIC A:  No active issues. P:  - CBC in AM. - Transfuse per ICU protocol.  INFECTIOUS A:  No active issues.  At risk for aspiration. P:   - Monitor WBC. - Monitor fever curve.  ENDOCRINE A:  No active issues.   P:   - Monitor.  NEUROLOGIC A:  Severe agitation, withdrawal from xanax and etoh, actively hallucinating - resolved, now just intermittent confusion and forgetfulness  P:   - Ativan IV. - CIWA protocol. - D/C precedex drip. - Thiamine, folate, MVI. - Monitor for airway protection.  FAMILY  - Updates: Parents updated bedside.  I discussed case with [facility] hospitalist and RN, will transfer to SDU, d/c precedex, transfer care to [facility], PCCM will sign off, please call back if needed.  Alyson Reedy, M.D. [facility] Pulmonary/Critical Care Medicine. Pager:  [phone]. After hours pager: [phone].  04/09/2015, 11:12 AM

## 2015-04-09 NOTE — Evaluation (Signed)
Physical Therapy Evaluation Patient Details Name: Lance Nguyen MRN: [id number] DOB: Jan 08, 1996 Today's Date: 04/09/2015   History of Present Illness  19 year old male presenting 04/08/15 from behavioral health with hallucination and reportedly alcohol and xanax withdrawal. The patient is actively hallucinating. In Ed for bizarre behavior after taking  Unknown substance.  Clinical Impression  Patient pleasant, mildly imbalanced while ambulating. Patient will benefit from PT to address problems listed in note below.    Follow Up Recommendations No PT follow up    Equipment Recommendations  None recommended by PT    Recommendations for Other Services       Precautions / Restrictions Precautions Precautions: Fall      Mobility  Bed Mobility Overal bed mobility: Needs Assistance Bed Mobility: Supine to Sit     Supine to sit: Min guard     General bed mobility comments: extra time, cues for safety  Transfers Overall transfer level: Needs assistance Equipment used: 1 person hand held assist Transfers: Sit to/from Stand Sit to Stand: Min guard            Ambulation/Gait Ambulation/Gait assistance: Min guard;Min assist Ambulation Distance (Feet): 425 Feet Assistive device: 1 person hand held assist Gait Pattern/deviations: Step-through pattern     General Gait Details: loss of balance mildly withstops and turns   Careers information officertairs            Wheelchair Mobility    Modified Rankin (Stroke Patients Only)       Balance Overall balance assessment: Needs assistance         Standing balance support: During functional activity;No upper extremity supported Standing balance-Leahy Scale: Fair                               Pertinent Vitals/Pain Pain Assessment: Faces Faces Pain Scale: Hurts little more Pain Location: R knee Pain Descriptors / Indicators: Discomfort    Home Living Family/patient expects to be discharged to:: Private  residence Living Arrangements: Parent;Other relatives Available Help at Discharge: Family Type of Home: House       Home Layout: One level        Prior Function Level of Independence: Independent               Hand Dominance        Extremity/Trunk Assessment   Upper Extremity Assessment: Generalized weakness           Lower Extremity Assessment: Generalized weakness      Cervical / Trunk Assessment: Normal  Communication   Communication: No difficulties  Cognition Arousal/Alertness: Awake/alert   Overall Cognitive Status: Impaired/Different from baseline Area of Impairment: Orientation;Safety/judgement Orientation Level: Time;Situation                  General Comments      Exercises        Assessment/Plan    PT Assessment Patient needs continued PT services  PT Diagnosis Difficulty walking;Generalized weakness   PT Problem List Decreased activity tolerance;Cardiopulmonary status limiting activity;Decreased mobility;Decreased balance;Decreased cognition;Decreased knowledge of precautions;Decreased safety awareness  PT Treatment Interventions Gait training;Functional mobility training;Balance training;Patient/family education;Cognitive remediation   PT Goals (Current goals can be found in the Care Plan section) Acute Rehab PT Goals Patient Stated Goal: to go home today PT Goal Formulation: With patient/family Time For Goal Achievement: 04/16/15 Potential to Achieve Goals: Good    Frequency Min 3X/week   Barriers to discharge  Co-evaluation               End of Session Equipment Utilized During Treatment: Gait belt   Patient left: in chair;with call bell/phone within reach;with family/visitor present Nurse Communication: Mobility status         Time: 1456-1516 PT Time Calculation (min) (ACUTE ONLY): 20 min   Charges:   PT Evaluation $Initial PT Evaluation Tier I: 1 Procedure     PT G CodesRada Hay 04/09/2015, 3:30 PM Blanchard Kelch PT [phone]

## 2015-04-10 DIAGNOSIS — IMO0001 Reserved for inherently not codable concepts without codable children: Secondary | ICD-10-CM

## 2015-04-10 DIAGNOSIS — F1310 Sedative, hypnotic or anxiolytic abuse, uncomplicated: Secondary | ICD-10-CM

## 2015-04-10 DIAGNOSIS — F12121 Cannabis abuse with intoxication delirium: Secondary | ICD-10-CM

## 2015-04-10 DIAGNOSIS — F19951 Other psychoactive substance use, unspecified with psychoactive substance-induced psychotic disorder with hallucinations: Secondary | ICD-10-CM

## 2015-04-10 LAB — MAGNESIUM: Magnesium: 2.1 mg/dL (ref 1.7–2.4)

## 2015-04-10 LAB — CBC
HCT: 36.5 % — ABNORMAL LOW (ref 39.0–52.0)
Hemoglobin: 12.4 g/dL — ABNORMAL LOW (ref 13.0–17.0)
MCH: 30.4 pg (ref 26.0–34.0)
MCHC: 34 g/dL (ref 30.0–36.0)
MCV: 89.5 fL (ref 78.0–100.0)
PLATELETS: 176 10*3/uL (ref 150–400)
RBC: 4.08 MIL/uL — ABNORMAL LOW (ref 4.22–5.81)
RDW: 11.9 % (ref 11.5–15.5)
WBC: 7 10*3/uL (ref 4.0–10.5)

## 2015-04-10 LAB — BASIC METABOLIC PANEL
Anion gap: 8 (ref 5–15)
BUN: 16 mg/dL (ref 6–20)
CALCIUM: 8.2 mg/dL — AB (ref 8.9–10.3)
CO2: 25 mmol/L (ref 22–32)
Chloride: 107 mmol/L (ref 101–111)
Creatinine, Ser: 0.69 mg/dL (ref 0.61–1.24)
GLUCOSE: 93 mg/dL (ref 65–99)
POTASSIUM: 3.5 mmol/L (ref 3.5–5.1)
Sodium: 140 mmol/L (ref 135–145)

## 2015-04-10 LAB — PHOSPHORUS: Phosphorus: 3.3 mg/dL (ref 2.5–4.6)

## 2015-04-10 MED ORDER — ADULT MULTIVITAMIN W/MINERALS CH
1.0000 | ORAL_TABLET | Freq: Every day | ORAL | Status: DC
  Administered 2015-04-10: 1 via ORAL
  Filled 2015-04-10: qty 1

## 2015-04-10 MED ORDER — VITAMIN B-1 100 MG PO TABS
100.0000 mg | ORAL_TABLET | Freq: Every day | ORAL | Status: DC
  Administered 2015-04-10: 100 mg via ORAL
  Filled 2015-04-10: qty 1
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
## 2015-04-10 NOTE — Progress Notes (Signed)
04/09/15 1900-0700 Patient is A/Ox2. He had no c/o pain during the shift. He is ambulatory with 1 person standby assist. Heart rate decreased to upper 40's non-sustained during sleep. Pt.is asymptomatic.

## 2015-04-10 NOTE — Consult Note (Signed)
[place] [facility] Consult   Reason for Consult:  Drug induced hallucinations, benzodiazepine withdrawal delirium and cannabis dependence  Referring Physician:  Dr. Sloan Leiter Patient Identification: Lance Nguyen MRN:  [id number] Principal Diagnosis: Drug induced hallucinations Diagnosis:   Patient Active Problem List   Diagnosis Date Noted  . Alcohol withdrawal [F10.239] 04/08/2015  . Psychosis [F29] 04/06/2015  . Altered mental status [R41.82]   . 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 Hallucinations [R44.3]     Total Time spent with patient: 1 hour  Subjective:   Lance Nguyen is a 19 y.o. male patient admitted with psychosis and than become delirious.  HPI:  Lance Nguyen is a 19 years old young male initially admitted to behavioral [facility] emergency department with the drug-induced hallucinations and then he develop benzodiazepine's withdrawal delirium required to send back to [facility] long emergency department and then admitted to stepdown unit with the preceded treatment. Patient was transferred to regular medical unit since he had significant improvement from his delirium and hallucinations at this time. Patient reportedly smoking marijuana 3-4 blunts daily for the last 4 years, abusing the Xanax 2 mg 3-4 times a week for the last one year and had experimental he tried hallucinogenic drug from a friend day of admission to the [facility] long hospital ER. Patient was suspended from [facility] for drug related charges and then placed on probation. Patient wears ankle bracelet. Patient reported his probation officer has a plan of indwelling court mandated substance abuse treatment program called [facility]. Patient mother and father seem stability supportive to the patient. Patient stated he landed his lesson does not want to hurt himself or his family members and will follow-up with mandated substance  abuse treatment program. Patient denies current symptoms of depression, anxiety, auditory/visual hallucinations, delusions, paranoia. Patient is awake, alert, oriented to time place person and situation.   HPI Elements:  Location: Drug induced hallucinations. Quality: acute, resolved. Severity: moderate. Timing: constant. Duration: since admission to [facility] long emergency departmentn Content: Polysubstance abuse versus dependence  Past Medical History: History reviewed. No pertinent past medical history. History reviewed. No pertinent past surgical history. Family History: No family history on file. Social History:  History  Alcohol Use No     History  Drug Use  . Yes  . Special: Marijuana    Comment: XANAX    History   Social History  . Marital Status: Single    Spouse Name: N/A  . Number of Children: N/A  . Years of Education: N/A   Social History Main Topics  . Smoking status: Never Smoker   . Smokeless tobacco: Not on file  . Alcohol Use: No  . Drug Use: Yes    Special: Marijuana     Comment: XANAX  . Sexual Activity: Not on file   Other Topics Concern  . None   Social History Narrative   Additional Social History:                          Allergies:  No Known Allergies  Labs:  Results for orders placed or performed during the hospital encounter of 04/08/15 (from the past 48 hour(s))  CBC with Differential/Platelet     Status: Abnormal   Collection Time: 04/08/15  1:41 PM  Result Value Ref Range   WBC 11.1 (H) 4.0 - 10.5 K/uL   RBC 4.00 (L) 4.22 - 5.81 MIL/uL   Hemoglobin 12.4 (L)  13.0 - 17.0 g/dL   HCT 36.1 (L) 39.0 - 52.0 %   MCV 90.3 78.0 - 100.0 fL   MCH 31.0 26.0 - 34.0 pg   MCHC 34.3 30.0 - 36.0 g/dL   RDW 11.9 11.5 - 15.5 %   Platelets 192 150 - 400 K/uL   Neutrophils Relative % 71 43 - 77 %   Neutro Abs 7.8 (H) 1.7 - 7.7 K/uL   Lymphocytes Relative 17 12 - 46 %   Lymphs Abs 1.9 0.7 - 4.0 K/uL   Monocytes Relative 12 3 -  12 %   Monocytes Absolute 1.4 (H) 0.1 - 1.0 K/uL   Eosinophils Relative 0 0 - 5 %   Eosinophils Absolute 0.0 0.0 - 0.7 K/uL   Basophils Relative 0 0 - 1 %   Basophils Absolute 0.0 0.0 - 0.1 K/uL  Comprehensive metabolic panel     Status: Abnormal   Collection Time: 04/08/15  1:41 PM  Result Value Ref Range   Sodium 138 135 - 145 mmol/L   Potassium 3.5 3.5 - 5.1 mmol/L   Chloride 105 101 - 111 mmol/L   CO2 26 22 - 32 mmol/L   Glucose, Bld 79 65 - 99 mg/dL   BUN 23 (H) 6 - 20 mg/dL   Creatinine, Ser 0.83 0.61 - 1.24 mg/dL   Calcium 8.4 (L) 8.9 - 10.3 mg/dL   Total Protein 6.2 (L) 6.5 - 8.1 g/dL   Albumin 3.8 3.5 - 5.0 g/dL   AST 150 (H) 15 - 41 U/L   ALT 53 17 - 63 U/L   Alkaline Phosphatase 49 38 - 126 U/L   Total Bilirubin 1.6 (H) 0.3 - 1.2 mg/dL   GFR calc non Af Amer >60 >60 mL/min   GFR calc Af Amer >60 >60 mL/min    Comment: (NOTE) The eGFR has been calculated using the CKD EPI equation. This calculation has not been validated in all clinical situations. eGFR's persistently <60 mL/min signify possible Chronic Kidney Disease.    Anion gap 7 5 - 15  APTT     Status: None   Collection Time: 04/08/15  1:41 PM  Result Value Ref Range   aPTT 32 24 - 37 seconds  Protime-INR     Status: Abnormal   Collection Time: 04/08/15  1:41 PM  Result Value Ref Range   Prothrombin Time 16.4 (H) 11.6 - 15.2 seconds   INR 1.31 0.00 - 1.49  Magnesium     Status: None   Collection Time: 04/08/15  1:41 PM  Result Value Ref Range   Magnesium 1.9 1.7 - 2.4 mg/dL  Phosphorus     Status: None   Collection Time: 04/08/15  1:41 PM  Result Value Ref Range   Phosphorus 3.4 2.5 - 4.6 mg/dL  CBC     Status: Abnormal   Collection Time: 04/09/15  4:14 AM  Result Value Ref Range   WBC 7.8 4.0 - 10.5 K/uL   RBC 3.94 (L) 4.22 - 5.81 MIL/uL   Hemoglobin 12.4 (L) 13.0 - 17.0 g/dL   HCT 36.3 (L) 39.0 - 52.0 %   MCV 92.1 78.0 - 100.0 fL   MCH 31.5 26.0 - 34.0 pg   MCHC 34.2 30.0 - 36.0 g/dL   RDW  12.0 11.5 - 15.5 %   Platelets 171 150 - 400 K/uL  Basic metabolic panel     Status: Abnormal   Collection Time: 04/09/15  4:14 AM  Result Value Ref Range  Sodium 137 135 - 145 mmol/L   Potassium 3.9 3.5 - 5.1 mmol/L   Chloride 107 101 - 111 mmol/L   CO2 22 22 - 32 mmol/L   Glucose, Bld 75 65 - 99 mg/dL   BUN 25 (H) 6 - 20 mg/dL   Creatinine, Ser 0.85 0.61 - 1.24 mg/dL   Calcium 8.5 (L) 8.9 - 10.3 mg/dL   GFR calc non Af Amer >60 >60 mL/min   GFR calc Af Amer >60 >60 mL/min    Comment: (NOTE) The eGFR has been calculated using the CKD EPI equation. This calculation has not been validated in all clinical situations. eGFR's persistently <60 mL/min signify possible Chronic Kidney Disease.    Anion gap 8 5 - 15  Magnesium     Status: None   Collection Time: 04/09/15  4:14 AM  Result Value Ref Range   Magnesium 2.1 1.7 - 2.4 mg/dL  Phosphorus     Status: None   Collection Time: 04/09/15  4:14 AM  Result Value Ref Range   Phosphorus 3.4 2.5 - 4.6 mg/dL  CBC     Status: Abnormal   Collection Time: 04/10/15  3:59 AM  Result Value Ref Range   WBC 7.0 4.0 - 10.5 K/uL   RBC 4.08 (L) 4.22 - 5.81 MIL/uL   Hemoglobin 12.4 (L) 13.0 - 17.0 g/dL   HCT 36.5 (L) 39.0 - 52.0 %   MCV 89.5 78.0 - 100.0 fL   MCH 30.4 26.0 - 34.0 pg   MCHC 34.0 30.0 - 36.0 g/dL   RDW 11.9 11.5 - 15.5 %   Platelets 176 150 - 400 K/uL  Basic metabolic panel     Status: Abnormal   Collection Time: 04/10/15  3:59 AM  Result Value Ref Range   Sodium 140 135 - 145 mmol/L   Potassium 3.5 3.5 - 5.1 mmol/L   Chloride 107 101 - 111 mmol/L   CO2 25 22 - 32 mmol/L   Glucose, Bld 93 65 - 99 mg/dL   BUN 16 6 - 20 mg/dL   Creatinine, Ser 0.69 0.61 - 1.24 mg/dL   Calcium 8.2 (L) 8.9 - 10.3 mg/dL   GFR calc non Af Amer >60 >60 mL/min   GFR calc Af Amer >60 >60 mL/min    Comment: (NOTE) The eGFR has been calculated using the CKD EPI equation. This calculation has not been validated in all clinical situations. eGFR's  persistently <60 mL/min signify possible Chronic Kidney Disease.    Anion gap 8 5 - 15  Magnesium     Status: None   Collection Time: 04/10/15  3:59 AM  Result Value Ref Range   Magnesium 2.1 1.7 - 2.4 mg/dL  Phosphorus     Status: None   Collection Time: 04/10/15  3:59 AM  Result Value Ref Range   Phosphorus 3.3 2.5 - 4.6 mg/dL    Vitals: Blood pressure 111/59, pulse 66, temperature 98.9 F (37.2 C), temperature source Oral, resp. rate 18, weight 70.4 kg (155 lb 3.3 oz), SpO2 100 %.  Risk to Self: Is patient at risk for suicide?: Yes Risk to Others:   Prior Inpatient Therapy:   Prior Outpatient Therapy:    Current Facility-Administered Medications  Medication Dose Route Frequency Provider Last Rate Last Dose  . enoxaparin (LOVENOX) injection 30 mg  30 mg Subcutaneous QHS Kara Mead V, MD   30 mg at 04/09/15 2140  . LORazepam (ATIVAN) injection 1-2 mg  1-2 mg Intravenous Q1H PRN Wesam  Kathryne Sharper, MD   1 mg at 04/09/15 [phone]  . multivitamin with minerals tablet 1 tablet  1 tablet Oral Daily Jonetta Osgood, MD   1 tablet at 04/10/15 [phone]  . thiamine (VITAMIN B-1) tablet 100 mg  100 mg Oral Daily Jonetta Osgood, MD   100 mg at 04/10/15 2585    Musculoskeletal: Strength & Muscle Tone: within normal limits Gait & Station: normal Patient leans: N/A  Psychiatric Specialty Exam: Physical Exam  ROS  Blood pressure 111/59, pulse 66, temperature 98.9 F (37.2 C), temperature source Oral, resp. rate 18, weight 70.4 kg (155 lb 3.3 oz), SpO2 100 %.Body mass index is 21.66 kg/(m^2).  General Appearance: Casual  Eye Contact::  Good  Speech:  Clear and Coherent  Volume:  Normal  Mood:  Euthymic  Affect:  Appropriate and Congruent  Thought Process:  Coherent, Goal Directed and Intact  Orientation:  Full (Time, Place, and Person)  Thought Content:  WDL  Suicidal Thoughts:  No  Homicidal Thoughts:  No  Memory:  Immediate;   Good Recent;   Good  Judgement:  Poor  Insight:  Fair   Psychomotor Activity:  Decreased  Concentration:  Good  Recall:  Good  Fund of Knowledge:Good  Language: Good  Akathisia:  Negative  Handed:  Right  AIMS (if indicated):     Assets:  Communication Skills Desire for Improvement Financial Resources/Insurance Housing Intimacy Leisure Time Physical Health Resilience Social Support Talents/Skills Transportation  ADL's:  Intact  Cognition: WNL  Sleep:      Medical Decision Making: Review of Psycho-Social Stressors (1), Review or order clinical lab tests (1), Established Problem, Worsening (2), Review of Last Therapy Session (1), Review of Medication Regimen & Side Effects (2) and Review of New Medication or Change in Dosage (2)  Treatment Plan Summary: Patient presented with a drug induced hallucinations, benzodiazepine withdrawal delirium and cannabis dependence. Patient symptoms of drug-induced psychosis, anxiety and withdrawal symptoms resolved with his current treatment. Patient has a plan of mandatory substance abuse treatment program upon discharge from the hospital. Patient contract for safety and has no current safety concerns. 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 Supportive therapy provided about ongoing stressors.  Disposition: Patient will be referred to the court mandated substance abuse treatment program [facility] as per planned by probation  officer.   ,JANARDHAHA R. 04/10/2015 10:48 AM

## 2015-04-10 NOTE — Clinical Social Work Note (Signed)
CSW attempted to facilitate psychosocial consult for pt but pt had already been discharge  .Elray Bubaegina Ardean Melroy, LCSW [facility] Clinical Social Worker - Weekend Coverage cell #: [phone]

## 2015-04-11 ENCOUNTER — Emergency Department (HOSPITAL_COMMUNITY)
Admission: EM | Admit: 2015-04-11 | Discharge: 2015-04-11 | Disposition: A | Discharge status: Home / Self Care | Payer: No Typology Code available for payment source

## 2015-04-11 ENCOUNTER — Encounter (HOSPITAL_BASED_OUTPATIENT_CLINIC_OR_DEPARTMENT_OTHER): Payer: Self-pay | Admitting: Emergency Medicine

## 2015-04-11 NOTE — ED Notes (Signed)
Chart accessed in reference to pt's mother calling asking about note for court. Pt's mother given flow manager's number.

## 2015-04-13 LAB — URINE DRUGS OF ABUSE SCREEN W ALC, ROUTINE (REF LAB)
Amphetamines, Urine: NEGATIVE ng/mL
BARBITURATE, UR: NEGATIVE ng/mL
BENZODIAZEPINE QUANT UR: NEGATIVE ng/mL
Cocaine (Metab.): NEGATIVE ng/mL
Ethanol U, Quan: NEGATIVE %
METHADONE SCREEN, URINE: NEGATIVE ng/mL
Opiate Quant, Ur: NEGATIVE ng/mL
PHENCYCLIDINE, UR: NEGATIVE ng/mL
Propoxyphene, Urine: NEGATIVE ng/mL

## 2015-04-13 LAB — PANEL 799049
CANNABINOID GC/MS UR: POSITIVE — AB
CARBOXY THC GC/MS CONF: 133 ng/mL

## 2016-01-24 ENCOUNTER — Emergency Department (HOSPITAL_COMMUNITY): Payer: No Typology Code available for payment source

## 2016-01-24 ENCOUNTER — Emergency Department (HOSPITAL_COMMUNITY)
Admission: EM | Admit: 2016-01-24 | Discharge: 2016-01-24 | Disposition: A | Discharge status: Home / Self Care | Payer: No Typology Code available for payment source

## 2016-01-24 ENCOUNTER — Emergency Department (HOSPITAL_COMMUNITY)
Admission: EM | Admit: 2016-01-24 | Discharge: 2016-01-24 | Disposition: A | Discharge status: Home / Self Care | Payer: No Typology Code available for payment source | Attending: Physician Assistant | Admitting: Physician Assistant

## 2016-01-24 DIAGNOSIS — S60221A Contusion of right hand, initial encounter: Secondary | ICD-10-CM | POA: Insufficient documentation

## 2016-01-24 DIAGNOSIS — Y998 Other external cause status: Secondary | ICD-10-CM | POA: Insufficient documentation

## 2016-01-24 DIAGNOSIS — S60222A Contusion of left hand, initial encounter: Secondary | ICD-10-CM

## 2016-01-24 DIAGNOSIS — Y9389 Activity, other specified: Secondary | ICD-10-CM | POA: Insufficient documentation

## 2016-01-24 DIAGNOSIS — Y9289 Other specified places as the place of occurrence of the external cause: Secondary | ICD-10-CM | POA: Insufficient documentation

## 2016-01-24 DIAGNOSIS — Z23 Encounter for immunization: Secondary | ICD-10-CM | POA: Insufficient documentation

## 2016-01-24 DIAGNOSIS — S60511A Abrasion of right hand, initial encounter: Secondary | ICD-10-CM | POA: Insufficient documentation

## 2016-01-24 DIAGNOSIS — W231XXA Caught, crushed, jammed, or pinched between stationary objects, initial encounter: Secondary | ICD-10-CM | POA: Insufficient documentation

## 2016-01-24 MED ORDER — TETANUS-DIPHTH-ACELL PERTUSSIS 5-2.5-18.5 LF-MCG/0.5 IM SUSP
0.5000 mL | Freq: Once | INTRAMUSCULAR | Status: AC
  Administered 2016-01-24: 0.5 mL via INTRAMUSCULAR
  Filled 2016-01-24: qty 0.5

## 2016-01-24 MED ORDER — BACITRACIN ZINC 500 UNIT/GM EX OINT
TOPICAL_OINTMENT | Freq: Two times a day (BID) | CUTANEOUS | Status: DC
  Administered 2016-01-24: 1 via TOPICAL
  Filled 2016-01-24: qty 0.9

## 2016-01-24 NOTE — ED Provider Notes (Signed)
CSN: [id number]     Arrival date & time 01/24/16  1307 History  By signing my name below, I, Tanda Rockers, attest that this documentation has been prepared under the direction and in the presence of [employer], PA-C. Electronically Signed: Tanda Rockers, ED Scribe. 01/24/2016. 2:22 PM.   Chief Complaint  Patient presents with  . Hand Injury   The history is provided by the patient. No language interpreter was used.     HPI Comments: Lance Nguyen is a 20 y.o. male who presents to the Emergency Department complaining of sudden onset, constant, right hand pain that began at 10:30 AM this morning. Pt states he slammed a door in anger and accidentally caught his right hand in the metal frame of the door. He has abrasions to the 2nd, 3rd, and 4th digits on his right hand. Denies weakness, numbness, tingling, or any other associated symptoms. Tetanus status unknown.   No past medical history on file. No past surgical history on file. No family history on file. Social History  Substance Use Topics  . Smoking status: Never Smoker   . Smokeless tobacco: Not on file  . Alcohol Use: No    Review of Systems  Musculoskeletal: Positive for arthralgias.  Skin: Positive for wound.  Neurological: Negative for weakness and numbness.  All other systems reviewed and are negative.  Allergies  Review of patient's allergies indicates no known allergies.  Home Medications   Prior to Admission medications   Not on File   BP 104/80 mmHg  Pulse 76  Temp(Src) 98.1 F (36.7 C) (Oral)  Resp 16  SpO2 100%   Physical Exam  Constitutional: He is oriented to person, place, and time. He appears well-developed and well-nourished. No distress.  HENT:  Head: Normocephalic and atraumatic.  Eyes: Conjunctivae are normal. Right eye exhibits no discharge. Left eye exhibits no discharge. No scleral icterus.  Cardiovascular: Normal rate.   Pulmonary/Chest: Effort normal.  Musculoskeletal: He  exhibits tenderness.  Superficial abrasions over right digits 2-4 over the PIP joint.  Mild swelling and tenderness.  No obvious bony deformity.  No evidence of tendon injury.   Neurological: He is alert and oriented to person, place, and time. Coordination normal.  Skin: Skin is warm and dry. No rash noted. He is not diaphoretic. No erythema. No pallor.  Psychiatric: He has a normal mood and affect. His behavior is normal.  Nursing note and vitals reviewed.   ED Course  Procedures (including critical care time)  DIAGNOSTIC STUDIES: Oxygen Saturation is 100% on RA, normal by my interpretation.    COORDINATION OF CARE: 2:19 PM-Discussed treatment plan which includes Tdap with pt at bedside and pt agreed to plan.   Labs Review Labs Reviewed - No data to display  Imaging Review Dg Hand Complete Right  01/24/2016  CLINICAL DATA:  Closed hand in door earlier today EXAM: RIGHT HAND - COMPLETE 3+ VIEW COMPARISON:  Apr 08, 2015 FINDINGS: Frontal, oblique, and lateral views were obtained. There is marked swelling in the region of the fourth PIP joint. There is milder swelling over the dorsal MCP joint regions. There is no demonstrable fracture or dislocation. There is no appreciable joint space narrowing. No erosive change. No radiopaque foreign body. IMPRESSION: Marked soft tissue swelling in the fourth PIP joint region. Milder swelling over the dorsal MCP joints. No acute fracture or dislocation. Electronically Signed   By: Bretta Bang III M.D.   On: 01/24/2016 13:57   Dg Finger Ring  Right  01/24/2016  CLINICAL DATA:  Closed hand in door this morning with fourth digit pain, initial encounter EXAM: RIGHT RING FINGER 2+V COMPARISON:  None. FINDINGS: Mild soft tissue swelling is noted. No acute fracture or dislocation is seen. No other focal abnormality is noted. IMPRESSION: Soft tissue swelling without acute bony abnormality. Electronically Signed   By: Alcide CleverMark  Lukens M.D.   On: 01/24/2016  13:59   I have personally reviewed and evaluated these images as part of my medical decision-making.   EKG Interpretation None      MDM   Final diagnoses:  Hand contusion, left, initial encounter   Patient X-Ray negative for obvious fracture or dislocation.  Wounds were cleaned and dressings applied. TD Updated. Pt advised to follow up with orthopedics. Patient's fingers buddy taped while in ED, conservative therapy recommended and discussed. Patient will be discharged home & is agreeable with above plan. Returns precautions discussed. Pt appears safe for discharge.  I personally performed the services described in this documentation, which was scribed in my presence. The recorded information has been reviewed and is accurate.       Lester KinsmanSamantha Tripp [place], PA-C 01/26/16 0024  Courteney Randall AnLyn Mackuen, MD 01/26/16 1005

## 2016-01-24 NOTE — ED Notes (Signed)
Pt slammed a door today in anger and accidentally caught his R hand in the door middle 3 fingers affected.

## 2016-01-24 NOTE — Discharge Instructions (Signed)
Hand Contusion A hand contusion is a deep bruise on your hand area. Contusions are the result of an injury that caused bleeding under the skin. The contusion may turn blue, purple, or yellow. Minor injuries will give you a painless contusion, but more severe contusions may stay painful and swollen for a few weeks. CAUSES  A contusion is usually caused by a blow, trauma, or direct force to an area of the body. SYMPTOMS   Swelling and redness of the injured area.  Discoloration of the injured area.  Tenderness and soreness of the injured area.  Pain. DIAGNOSIS  The diagnosis can be made by taking a history and performing a physical exam. An X-ray, CT scan, or MRI may be needed to determine if there were any associated injuries, such as broken bones (fractures). TREATMENT  Often, the best treatment for a hand contusion is resting, elevating, icing, and applying cold compresses to the injured area. Over-the-counter medicines may also be recommended for pain control. HOME CARE INSTRUCTIONS   Put ice on the injured area.  Put ice in a plastic bag.  Place a towel between your skin and the bag.  Leave the ice on for 15-20 minutes, 03-04 times a day.  Only take over-the-counter or prescription medicines as directed by your caregiver. Your caregiver may recommend avoiding anti-inflammatory medicines (aspirin, ibuprofen, and naproxen) for 48 hours because these medicines may increase bruising.  If told, use an elastic wrap as directed. This can help reduce swelling. You may remove the wrap for sleeping, showering, and bathing. If your fingers become numb, cold, or blue, take the wrap off and reapply it more loosely.  Elevate your hand with pillows to reduce swelling.  Avoid overusing your hand if it is painful. SEEK IMMEDIATE MEDICAL CARE IF:   You have increased redness, swelling, or pain in your hand.  Your swelling or pain is not relieved with medicines.  You have loss of feeling in  your hand or are unable to move your fingers.  Your hand turns cold or blue.  You have pain when you move your fingers.  Your hand becomes warm to the touch.  Your contusion does not improve in 2 days. MAKE SURE YOU:   Understand these instructions.  Will watch your condition.  Will get help right away if you are not doing well or get worse.   This information is not intended to replace advice given to you by your health care provider. Make sure you discuss any questions you have with your health care provider.  Follow up with orthopedic provider if your symptoms do not improve. Apply ice to affected area. Return to the ED if you experience severe worsening of your symptoms, redness or swelling around wound.

## 2016-05-23 IMAGING — CR DG HAND COMPLETE 3+V*R*
3 series · 3 of 3 positions shown · non-contrast
Comparison: None.

CLINICAL DATA: Swelling and redness noted to 2rd-2th metacarpals of
right hand. Unsure as to history. Questionable dog bite. Pt
partially sedated. Psychotic behavior.

EXAM:
RIGHT HAND - COMPLETE 3+ VIEW

[x hand pa right]
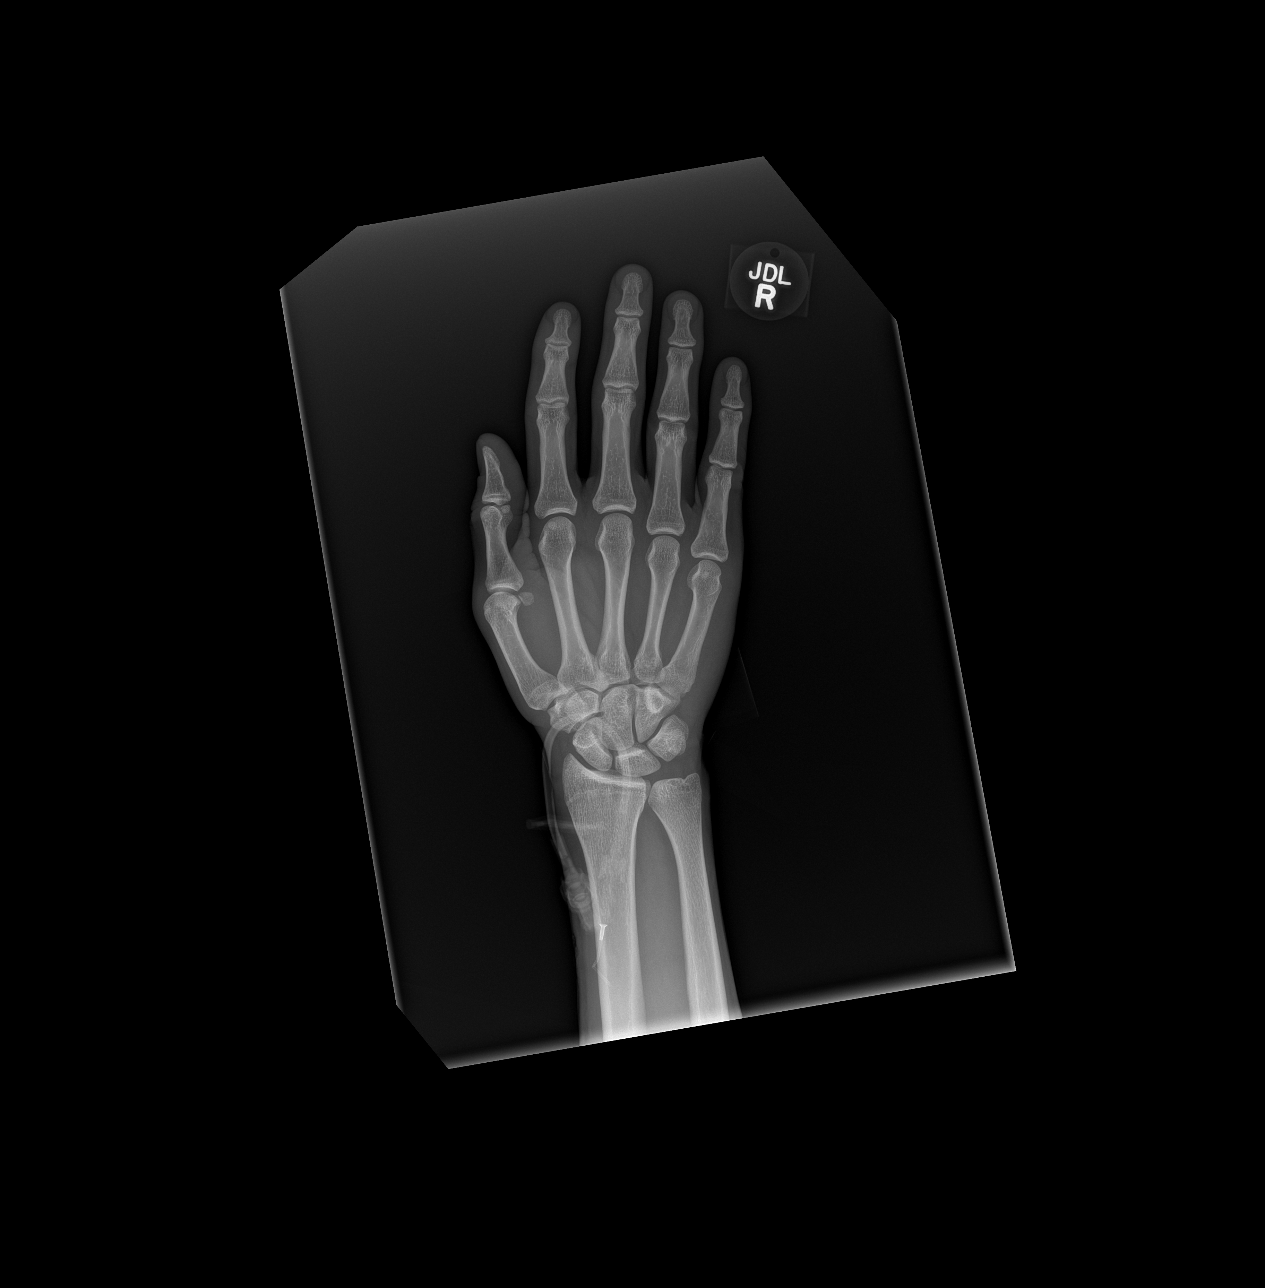

[x hand obl right]
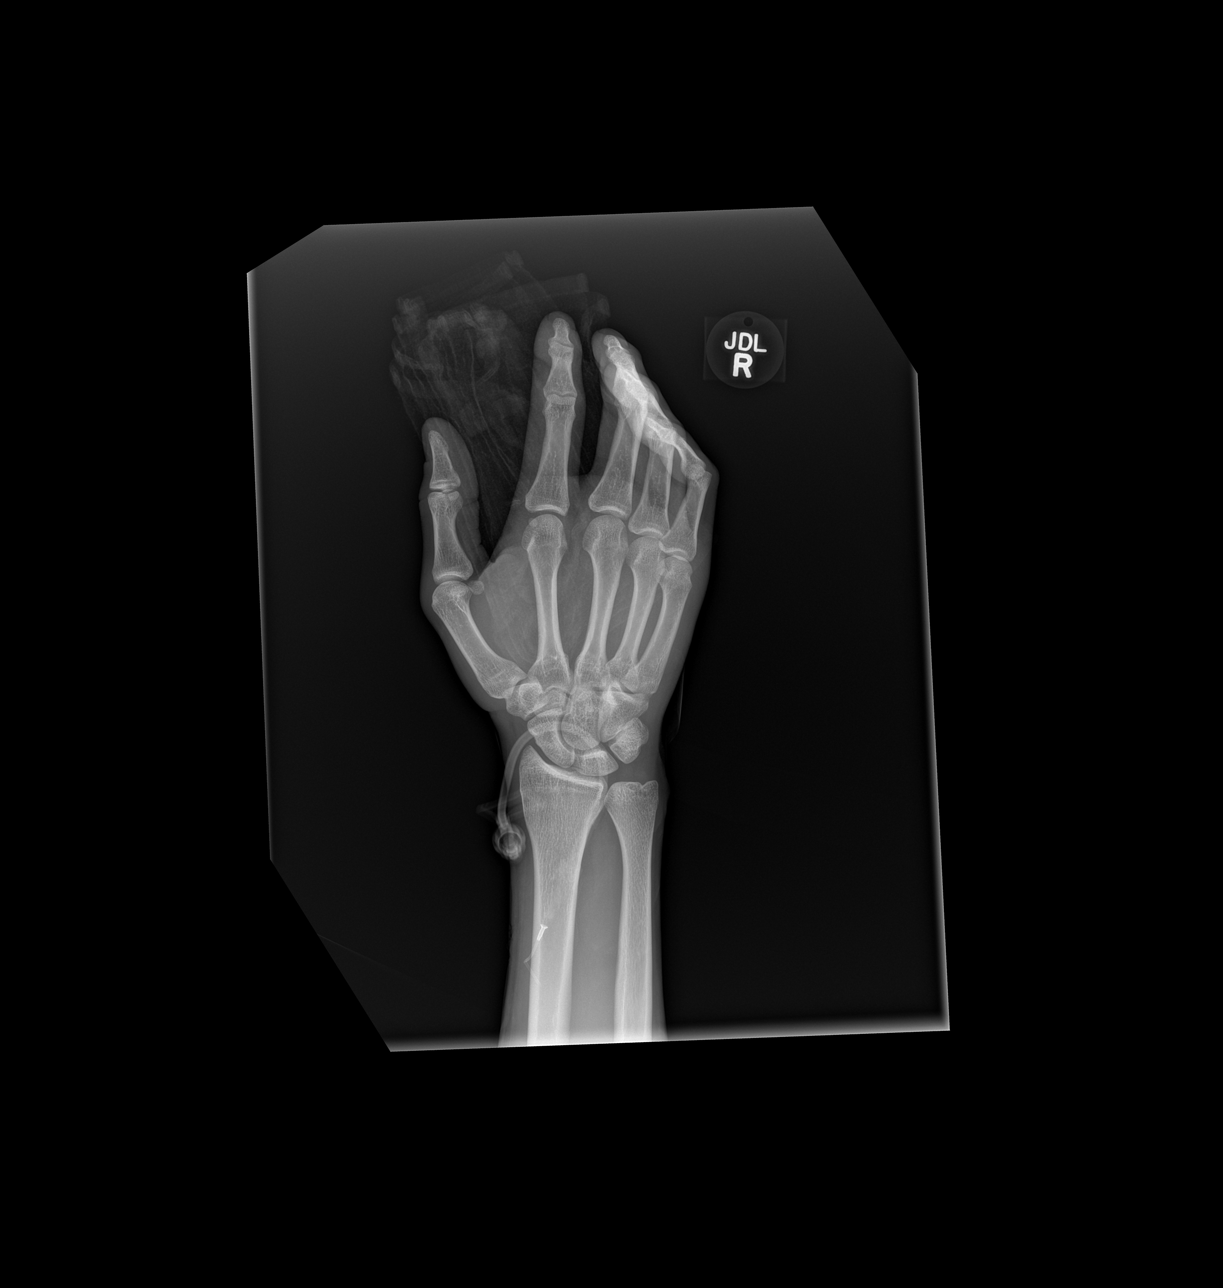

[x hand lat right]
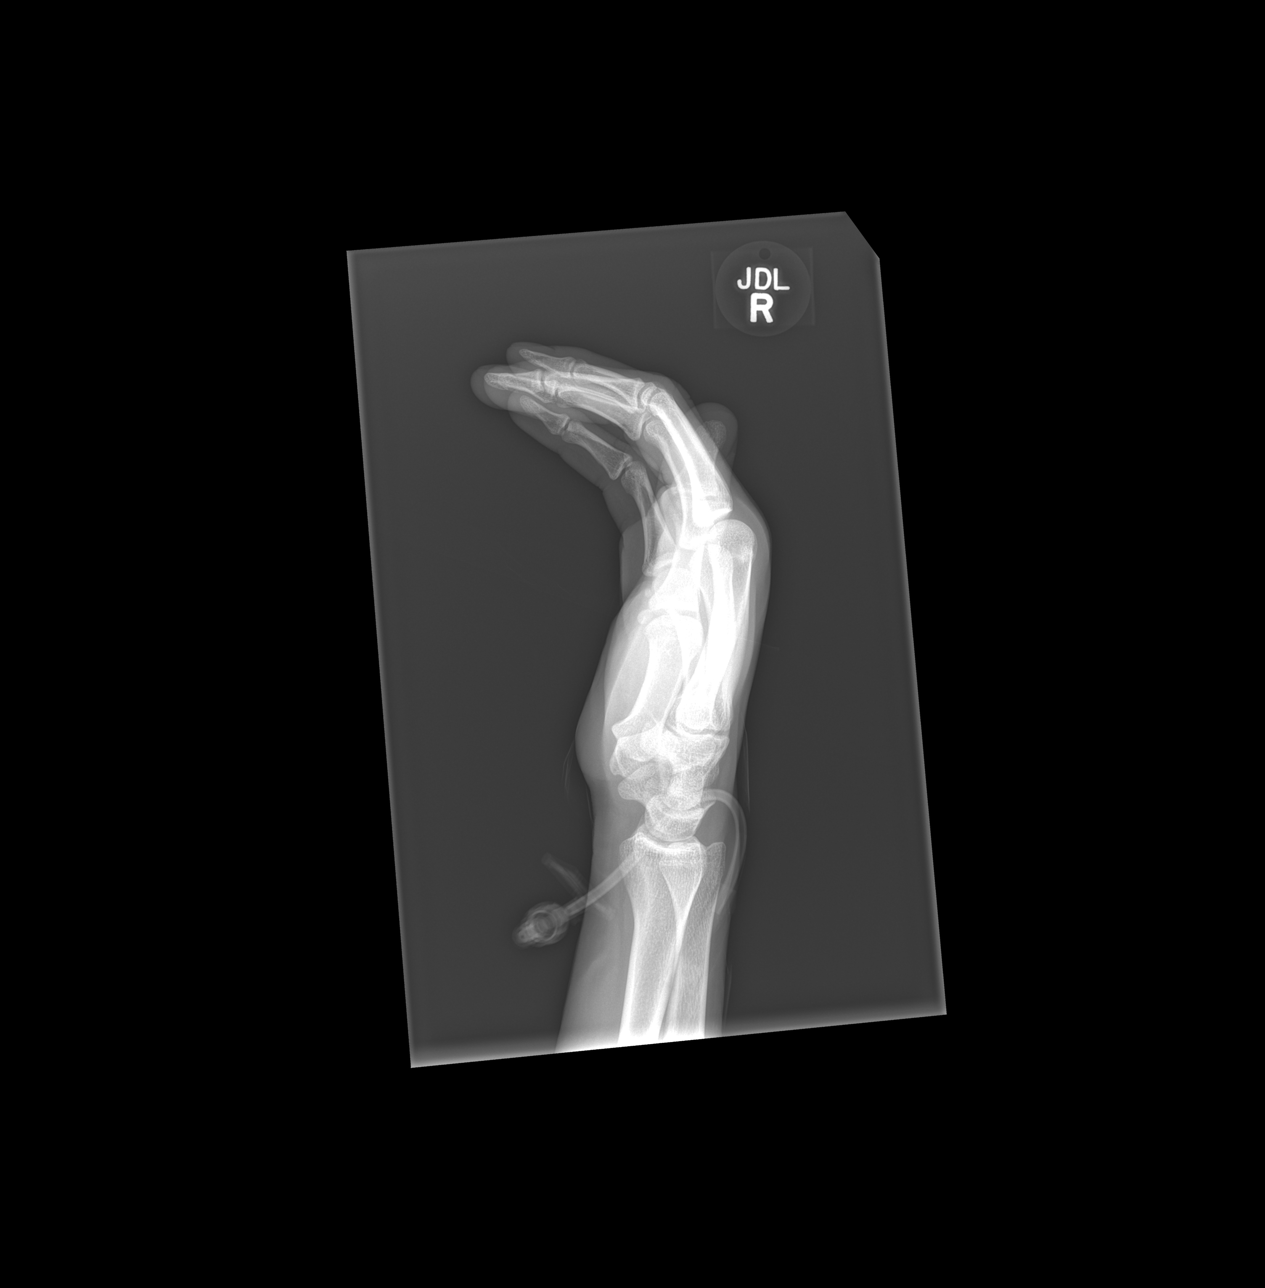

[3 of 3 positions shown; findings below may reference images not displayed]

FINDINGS: There is no evidence of fracture or dislocation. There is no
evidence of arthropathy or other focal bone abnormality. Soft
tissues are unremarkable.
IMPRESSION: Negative.

## 2016-05-23 IMAGING — CR DG CHEST 1V PORT
1 series · 1 of 1 positions shown · non-contrast
Comparison: 03/23/2015

CLINICAL DATA: Mental status changes and withdrawal symptoms.

EXAM:
PORTABLE CHEST - 1 VIEW

[AP]
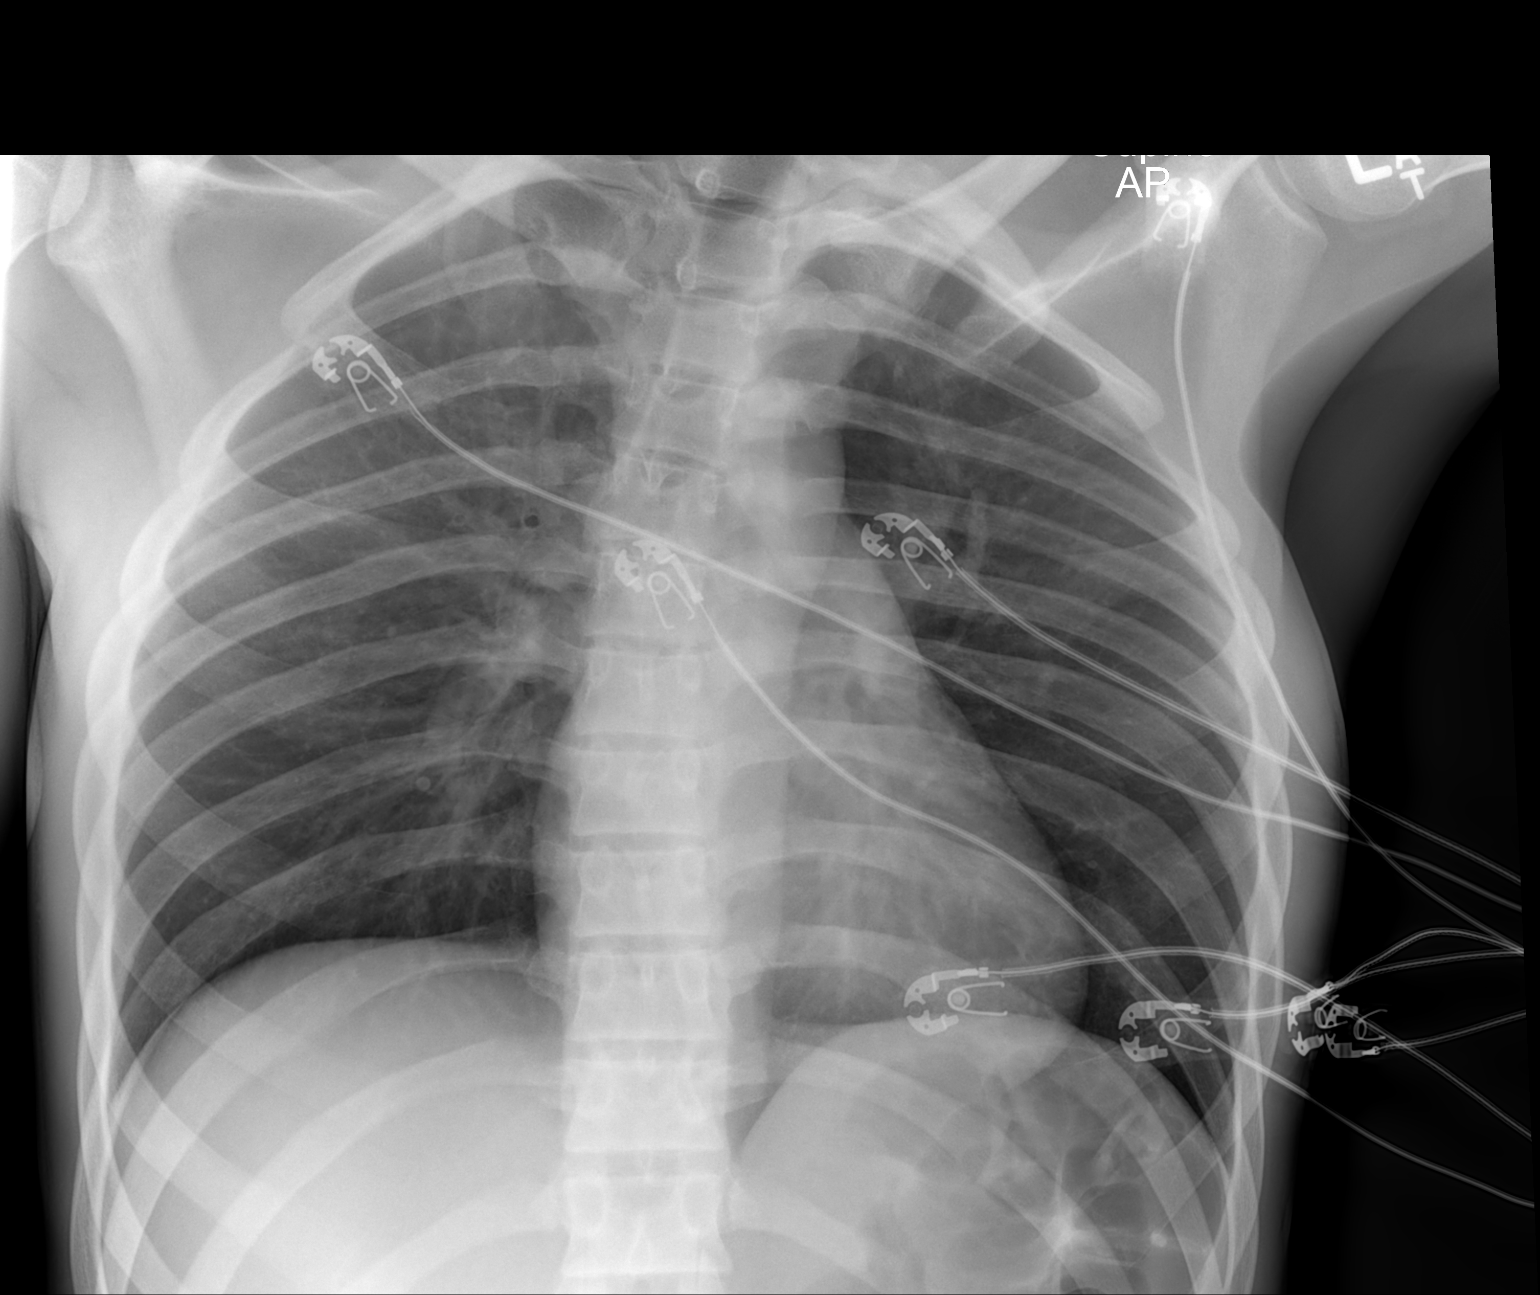

[1 of 1 positions shown; findings below may reference images not displayed]

FINDINGS: Mild right-sided perihilar density likely relates to atelectasis.
Subtle infiltrate cannot be excluded. No edema, pneumothorax or
pleural fluid is identified. The heart size and mediastinal contours
are normal.
IMPRESSION: Mild right-sided perihilar density likely relates to atelectasis.
Subtle infiltrate is not excluded.

## 2016-05-23 IMAGING — CT CT HEAD W/O CM
2 series · 17 of 30 positions shown, 20 images · non-contrast
Comparison: 04/04/2015 and 03/23/2015

CLINICAL DATA: Altered mental status. Question DT. Psychotic state.

EXAM:
CT HEAD WITHOUT CONTRAST
TECHNIQUE: Contiguous axial images were obtained from the base of the skull
through the vertex without intravenous contrast.

[Series 2: head w/o · axial · non-contrast · 0.45mm/px · z∈[-201,-81]mm · 9 of 30 slices shown, 12 images]
[im 3/30  brain]
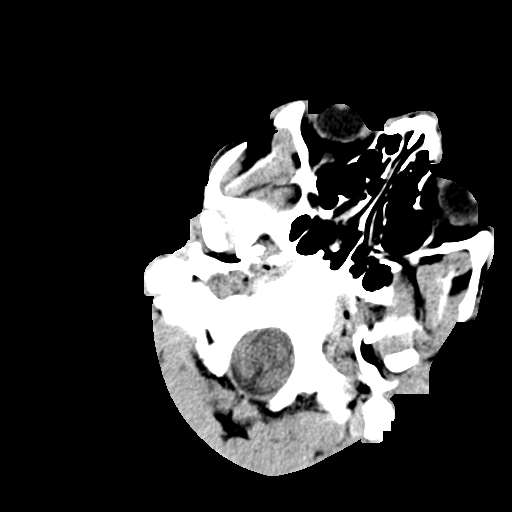
[im 3/30  bone]
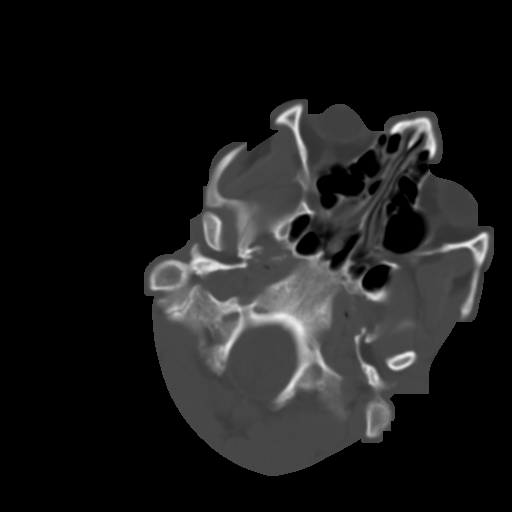
[im 6/30  brain]
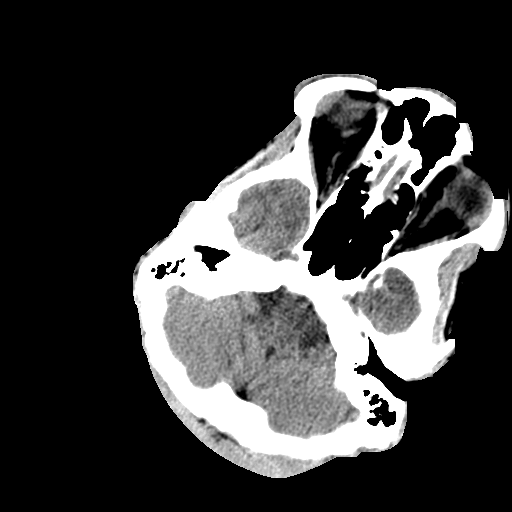
[im 9/30  brain]
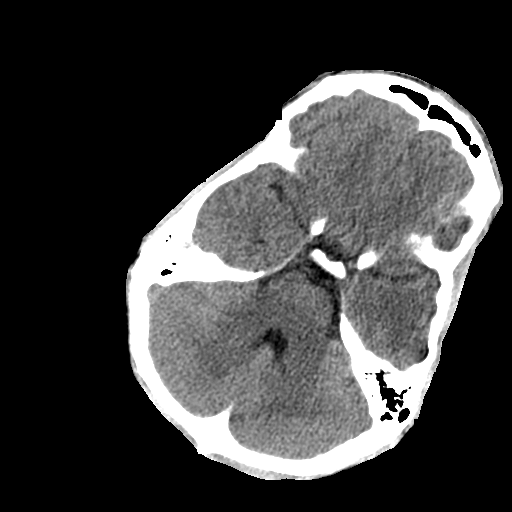
[im 12/30  brain]
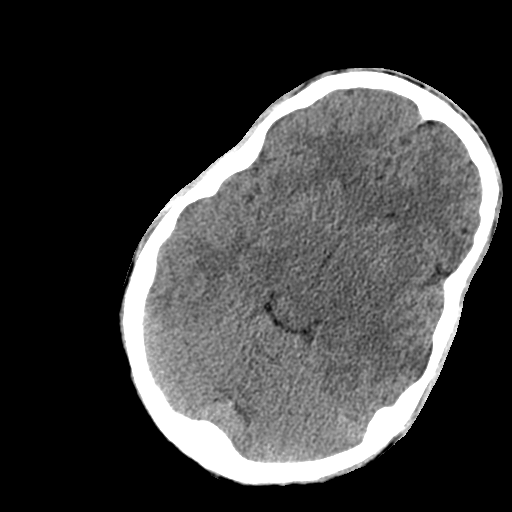
[im 15/30  brain]
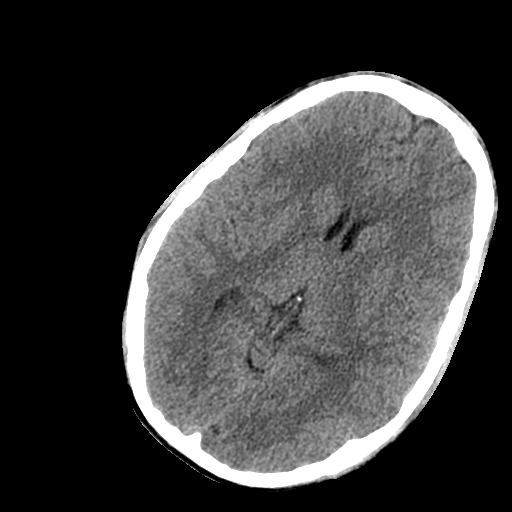
[im 15/30  bone]
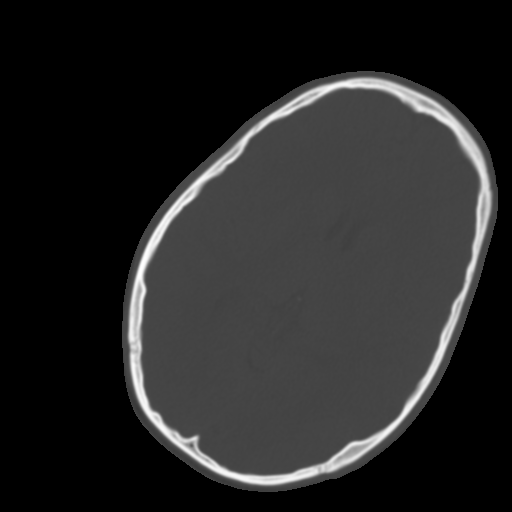
[im 18/30  brain]
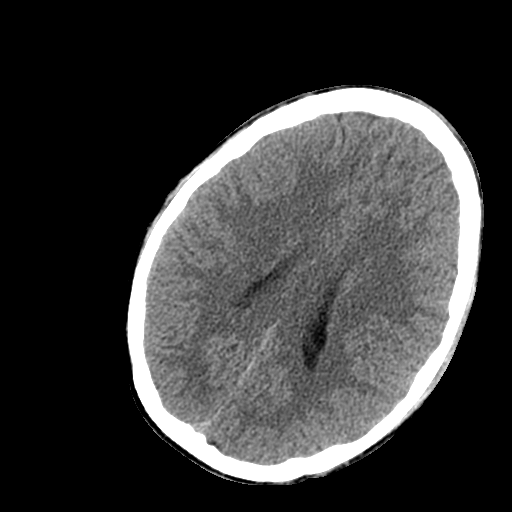
[im 21/30  brain]
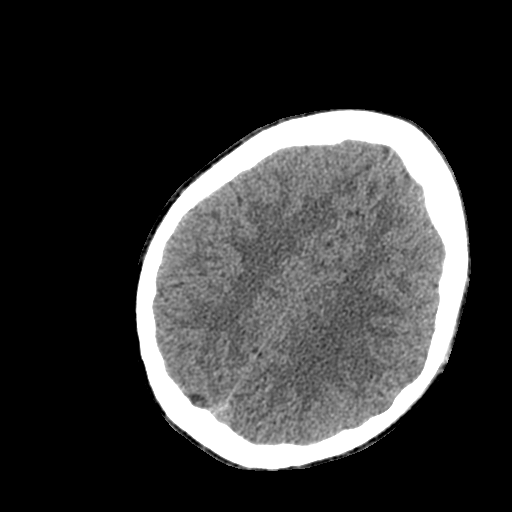
[im 24/30  brain]
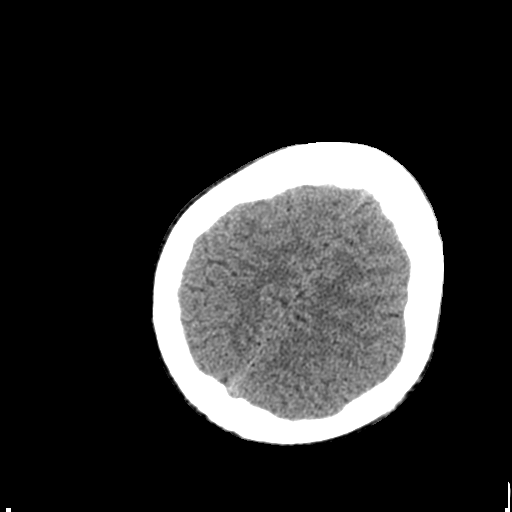
[im 27/30  brain]
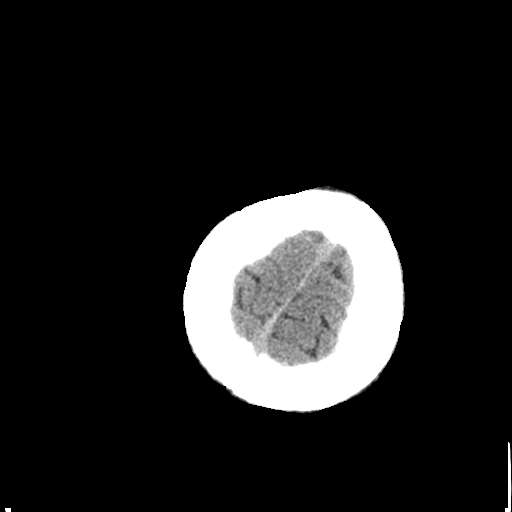
[im 27/30  bone]
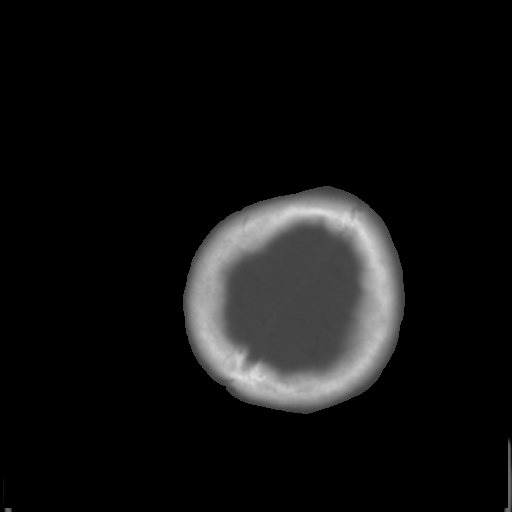

[Series 3: bone windows · axial · 0.45mm/px · z∈[-196,-82]mm · 8 of 50 slices shown]
[im 6/50  bone]
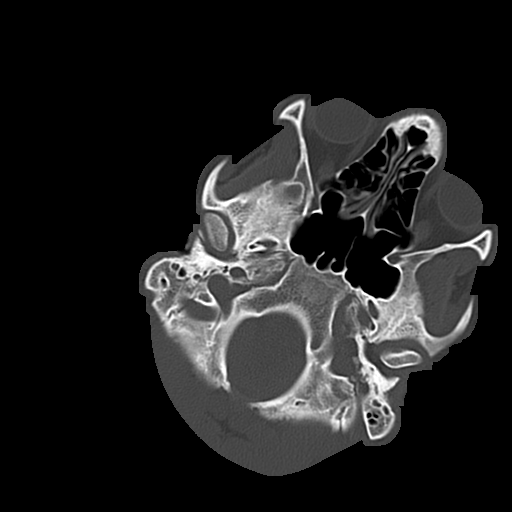
[im 11/50  bone]
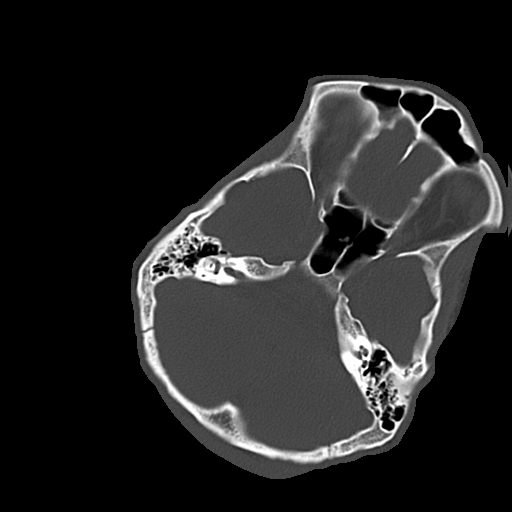
[im 17/50  bone]
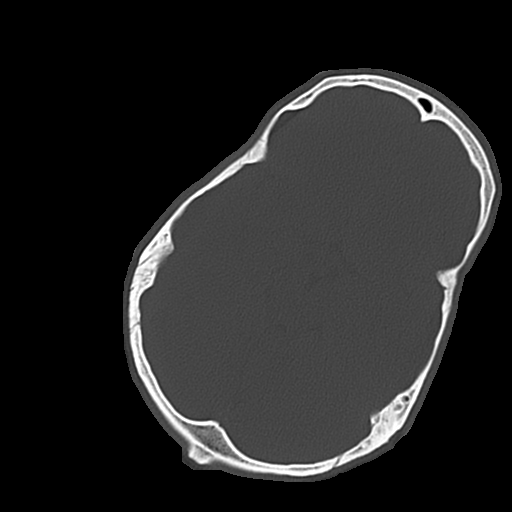
[im 22/50  bone]
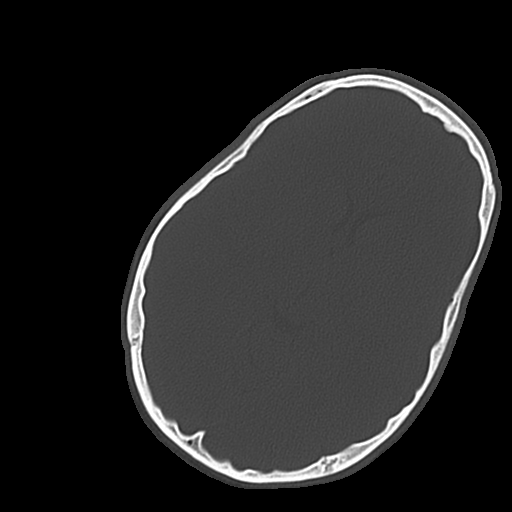
[im 28/50  bone]
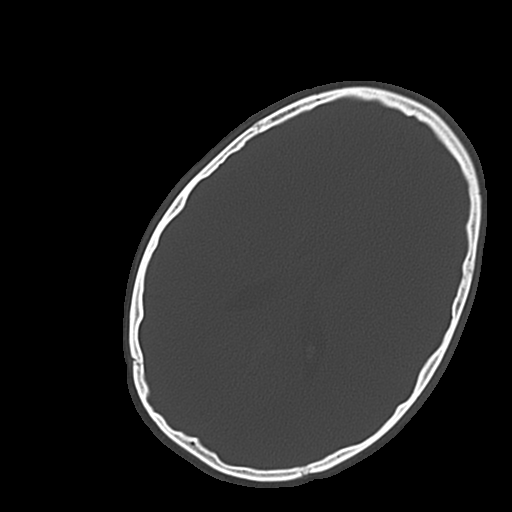
[im 33/50  bone]
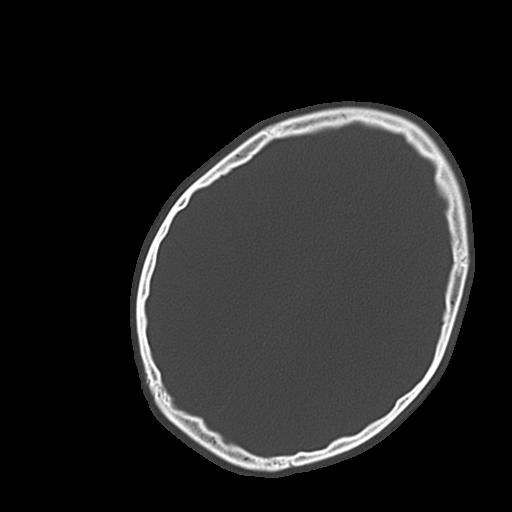
[im 39/50  bone]
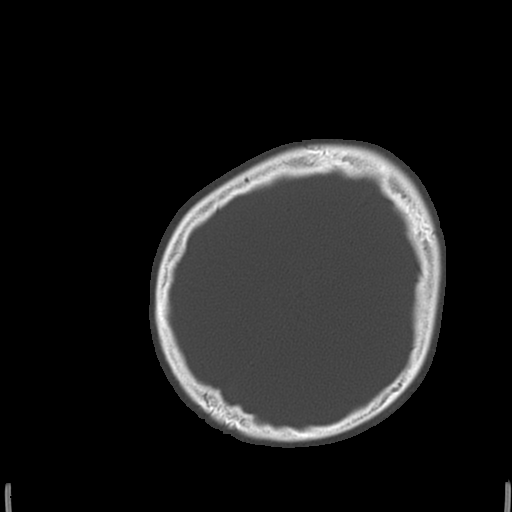
[im 44/50  bone]
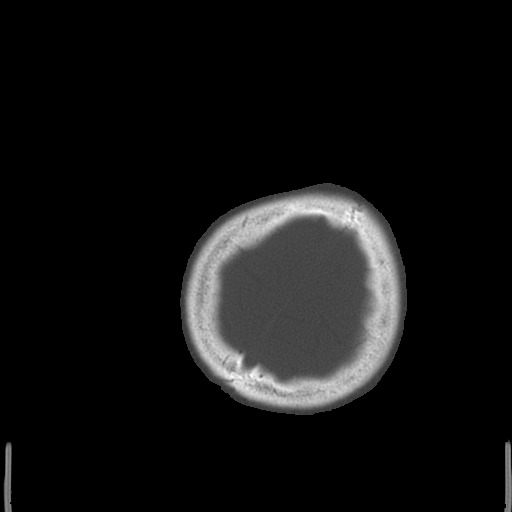

[17 of 30 positions shown; findings below may reference images not displayed]

FINDINGS: Ventricles, cisterns and other CSF spaces are normal. There is no
mass, mass effect, shift of midline structures or acute hemorrhage.
There is no evidence acute infarction. Remaining bones and soft
tissues are within normal.
IMPRESSION: Normal head CT.

## 2016-10-16 ENCOUNTER — Emergency Department (HOSPITAL_COMMUNITY)
Admission: EM | Admit: 2016-10-16 | Discharge: 2016-10-16 | Disposition: A | Discharge status: Home / Self Care | Payer: Medicaid Other | Attending: Emergency Medicine | Admitting: Emergency Medicine

## 2016-10-16 DIAGNOSIS — K029 Dental caries, unspecified: Secondary | ICD-10-CM | POA: Insufficient documentation

## 2016-10-16 MED ORDER — PENICILLIN V POTASSIUM 500 MG PO TABS: 500.0000 mg | ORAL_TABLET | Freq: Four times a day (QID) | ORAL | 0 refills | Status: AC

## 2016-10-16 MED ORDER — NAPROXEN 375 MG PO TABS
375.0000 mg | ORAL_TABLET | Freq: Two times a day (BID) | ORAL | 0 refills | Status: AC
Start: 2016-10-16 — End: ?

## 2016-10-16 MED ORDER — PENICILLIN V POTASSIUM 500 MG PO TABS
500.0000 mg | ORAL_TABLET | Freq: Once | ORAL | Status: AC
  Administered 2016-10-16: 500 mg via ORAL
  Filled 2016-10-16: qty 1

## 2016-10-16 MED ORDER — NAPROXEN 500 MG PO TABS
500.0000 mg | ORAL_TABLET | Freq: Once | ORAL | Status: AC
  Administered 2016-10-16: 500 mg via ORAL
  Filled 2016-10-16: qty 1

## 2016-10-16 NOTE — ED Provider Notes (Signed)
[facility]-EMERGENCY DEPT Provider Note   CSN: [id number] Arrival date & time: 10/16/16  2133  By signing my name below, I, [name], attest that this documentation has been prepared under the direction and in the presence of [name], MD . Electronically Signed: [name], Scribe. 10/16/2016. 11:20 PM.  History   Chief Complaint Chief Complaint  Patient presents with  . Dental Pain   The history is provided by the patient. No language interpreter was used.  Dental Pain   This is a chronic problem. The current episode started 2 days ago. The problem occurs constantly. The problem has been gradually worsening. The pain is mild. He has tried nothing for the symptoms. The treatment provided no relief.   HPI Comments: Lance Nguyen is a 20 y.o. male who presents to the Emergency Department complaining of gradually worsening, dental pain that he attributes to "cavities." Pt reports he "has sensitive teeth and is prone to cavities." He believes he may have "eaten something that went too deep."   No past medical history on file.  Patient Active Problem List   Diagnosis Date Noted  . Drug induced hallucinations (HCC) 04/10/2015  . Cannabis dependence with intoxication delirium with complication 04/10/2015  . 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 Alcohol withdrawal (HCC) 04/08/2015  . Psychosis 04/06/2015  . Altered mental status   . Psychoactive substance-induced mood disorder (HCC) 04/05/2015  . Hallucinations     No past surgical history on file.  Home Medications    Prior to Admission medications   Not on File    Family History No family history on file.  Social History Social History  Substance Use Topics  . Smoking status: Never Smoker  . Smokeless tobacco: Not on file  . Alcohol use No     Allergies   Patient has no known allergies.   Review of Systems Review of Systems  Constitutional: Negative for fever.  HENT: Positive for  dental problem.   All other systems reviewed and are negative.  Physical Exam Updated Vital Signs BP 135/92 (BP Location: Left Arm)   Pulse (!) 57   Temp 97.6 F (36.4 C) (Oral)   Resp 18   Ht 6\' 1"  (1.854 m)   Wt 165 lb (74.8 kg)   SpO2 99%   BMI 21.77 kg/m   Physical Exam  Constitutional: He appears well-developed and well-nourished.  HENT:  Head: Normocephalic.  Mouth/Throat: Oropharynx is clear and moist. No oropharyngeal exudate.  Left lower first and second molar and wisdom teeth all have cavities. Wisdom tooth is only partially erupted. Left upper and right upper teeth also have cavities. No trismus  Eyes: Conjunctivae and EOM are normal. Pupils are equal, round, and reactive to light. Right eye exhibits no discharge. Left eye exhibits no discharge. No scleral icterus.  Neck: Normal range of motion. Neck supple. No JVD present. No tracheal deviation present.  Trachea is midline. No stridor or carotid bruits. No trismus.   Cardiovascular: Normal rate, regular rhythm, normal heart sounds and intact distal pulses.   No murmur heard. Pulmonary/Chest: Effort normal and breath sounds normal. No stridor. No respiratory distress. He has no wheezes. He has no rales.  Lungs CTA bilaterally.  Abdominal: Soft. Bowel sounds are normal. He exhibits no distension. There is no tenderness. There is no rebound and no guarding.  Musculoskeletal: Normal range of motion. He exhibits no edema or tenderness.  All compartments are soft. No palpable cords.   Lymphadenopathy:  He has no cervical adenopathy.  Neurological: He is alert. He has normal reflexes. He displays normal reflexes.  DTRs 2+  Skin: Skin is warm and dry. Capillary refill takes less than 2 seconds.  Psychiatric: He has a normal mood and affect. His behavior is normal.  Nursing note and vitals reviewed.  ED Treatments / Results   Vitals:   10/16/16 2142  BP: 135/92  Pulse: (!) 57  Resp: 18  Temp: 97.6 F (36.4 C)     Procedures Procedures (including critical care time)  COORDINATION OF CARE:  11:18 PM Discussed treatment plan with pt at bedside and pt agreed to plan.  Final Clinical Impressions(s) / ED Diagnoses  Dental caries: All questions answered to patient's satisfaction. Based on history and exam patient has been appropriately medically screened and emergency conditions excluded. Patient is stable for discharge at this time. Follow up with your PMD for recheck in 2 days and strict return precautions given.    [name], MD 10/16/16 2325

## 2016-10-16 NOTE — ED Triage Notes (Signed)
Pt reports having "bad tooth that I got something in and I guess I went too deep." Pt reports ear pain, tongue pain, dizziness, and swelling to lower left jaw.

## 2019-06-08 ENCOUNTER — Encounter (HOSPITAL_COMMUNITY): Payer: Self-pay | Admitting: Emergency Medicine

## 2019-06-08 ENCOUNTER — Other Ambulatory Visit: Payer: Self-pay

## 2019-06-08 ENCOUNTER — Emergency Department (HOSPITAL_COMMUNITY)
Admission: EM | Admit: 2019-06-08 | Discharge: 2019-06-08 | Disposition: A | Discharge status: Home / Self Care | Payer: Self-pay | Attending: Emergency Medicine | Admitting: Emergency Medicine

## 2019-06-08 DIAGNOSIS — Z79899 Other long term (current) drug therapy: Secondary | ICD-10-CM | POA: Insufficient documentation

## 2019-06-08 DIAGNOSIS — R5383 Other fatigue: Secondary | ICD-10-CM | POA: Insufficient documentation

## 2019-06-08 DIAGNOSIS — R0981 Nasal congestion: Secondary | ICD-10-CM | POA: Insufficient documentation

## 2019-06-08 DIAGNOSIS — J029 Acute pharyngitis, unspecified: Secondary | ICD-10-CM | POA: Insufficient documentation

## 2019-06-08 DIAGNOSIS — R05 Cough: Secondary | ICD-10-CM | POA: Insufficient documentation

## 2019-06-08 DIAGNOSIS — R509 Fever, unspecified: Secondary | ICD-10-CM | POA: Insufficient documentation

## 2019-06-08 LAB — GROUP A STREP BY PCR: Group A Strep by PCR: NOT DETECTED

## 2019-06-08 NOTE — ED Notes (Signed)
Patient refused Covid test. Explain to patient that because he had symptoms he will still need to stay in for 14 days. Patient states he understand. Infection prevention reviewed.

## 2019-06-08 NOTE — ED Triage Notes (Signed)
Pt reports having sore throat for the last 2 days. With sharp pains when swallowing.

## 2019-06-08 NOTE — ED Provider Notes (Signed)
[facility] DEPT Provider Note   CSN: [id number] Arrival date & time: 06/08/19  0330     History   Chief Complaint Chief Complaint  Patient presents with  . Sore Throat    HPI Lance Nguyen is a 23 y.o. male who presents to the emergency department with complaints of sore throat for the past 2 to 3 days.  Patient states his sore throat is intermittent, worse with swallowing, but he is able to swallow.  No alleviating factors.  He states that associated mild congestion, minimal cough, subjective fevers, and at times feels somewhat fatigued.  Denies ear pain, change in voice, drooling, vomiting, chest pain, dyspnea, or abdominal pain.  He denies sick contacts with similar symptoms.  No known exposures to COVID-19, however he is concerned for this diagnoses.Denies tick exposure or recent excess outdoor activity.      HPI  History reviewed. No pertinent past medical history.  Patient Active Problem List   Diagnosis Date Noted  . Drug induced hallucinations ([place]) 04/10/2015  . Cannabis dependence with intoxication delirium with complication 40/98/1191  . Sedative, hypnotic or anxiolytic abuse, episodic ([place]) 04/10/2015  . Alcohol withdrawal ([place]) 04/08/2015  . Psychosis ([place]) 04/06/2015  . Altered mental status   . Psychoactive substance-induced mood disorder ([place]) 04/05/2015  . Hallucinations     History reviewed. No pertinent surgical history.      Home Medications    Prior to Admission medications   Medication Sig Start Date End Date Taking? Authorizing Provider  naproxen (NAPROSYN) 375 MG tablet Take 1 tablet (375 mg total) by mouth 2 (two) times daily. 10/16/16   Palumbo, April, MD    Family History History reviewed. No pertinent family history.  Social History Social History   Tobacco Use  . Smoking status: Never Smoker  . Smokeless tobacco: Never Used  Substance Use Topics  . Alcohol use: No  . Drug use: Yes    Types:  Marijuana    Comment: XANAX     Allergies   Patient has no known allergies.   Review of Systems Review of Systems  Constitutional: Positive for fatigue (mild) and fever (subjective). Negative for chills.  HENT: Positive for congestion, sore throat and trouble swallowing (painful but able). Negative for drooling, ear pain and voice change.   Respiratory: Positive for cough. Negative for shortness of breath.   Cardiovascular: Negative for chest pain.  Gastrointestinal: Negative for abdominal pain and vomiting.     Physical Exam Updated Vital Signs BP 139/85 (BP Location: Right Arm)   Pulse 76   Temp 99.5 F (37.5 C) (Oral)   Resp 18   Ht 6\' 2"  (1.88 m)   Wt 83.9 kg   SpO2 98%   BMI 23.75 kg/m   Physical Exam Vitals signs and nursing note reviewed.  Constitutional:      General: He is not in acute distress.    Appearance: He is well-developed. He is not toxic-appearing.  HENT:     Head: Normocephalic and atraumatic.     Right Ear: Tympanic membrane, ear canal and external ear normal. Tympanic membrane is not perforated, erythematous, retracted or bulging.     Left Ear: Tympanic membrane, ear canal and external ear normal. Tympanic membrane is not perforated, erythematous, retracted or bulging.     Ears:     Comments: No mastoid erythema, swelling, or tenderness.    Nose: Congestion present.     Right Sinus: No maxillary sinus tenderness or frontal sinus  tenderness.     Left Sinus: No maxillary sinus tenderness or frontal sinus tenderness.     Mouth/Throat:     Mouth: Mucous membranes are moist.     Pharynx: Posterior oropharyngeal erythema (Mild) present. No oropharyngeal exudate or uvula swelling.     Tonsils: No tonsillar exudate.     Comments: Posterior oropharynx is symmetric appearing. Patient tolerating own secretions without difficulty. No trismus. No drooling. No hot potato voice. No swelling beneath the tongue, submandibular compartment is soft.  Eyes:      General:        Right eye: No discharge.        Left eye: No discharge.     Conjunctiva/sclera: Conjunctivae normal.  Neck:     Musculoskeletal: Neck supple. No edema, neck rigidity or crepitus.  Cardiovascular:     Rate and Rhythm: Normal rate and regular rhythm.  Pulmonary:     Effort: Pulmonary effort is normal. No respiratory distress.     Breath sounds: Normal breath sounds. No wheezing, rhonchi or rales.  Abdominal:     General: There is no distension.     Palpations: Abdomen is soft.     Tenderness: There is no abdominal tenderness.  Lymphadenopathy:     Cervical: No cervical adenopathy.  Skin:    General: Skin is warm and dry.     Findings: No rash.  Neurological:     Mental Status: He is alert.     Comments: Clear speech.   Psychiatric:        Behavior: Behavior normal.      ED Treatments / Results  Labs (all labs ordered are listed, but only abnormal results are displayed) Labs Reviewed  GROUP A STREP BY PCR  NOVEL CORONAVIRUS, NAA (HOSPITAL ORDER, SEND-OUT TO REF LAB)    EKG None  Radiology No results found.  Procedures Procedures (including critical care time)  Medications Ordered in ED Medications - No data to display   Initial Impression / Assessment and Plan / ED Course  I have reviewed the triage vital signs and the nursing notes.  Pertinent labs & imaging results that were available during my care of the patient were reviewed by me and considered in my medical decision making (see chart for details).    Patient presents with sore throat associated w/ congestion, minimal cough, subjective fevers, & mild fatigue. Patient is nontoxic appearing, in no apparent distress, vitals are WNL. Patient is afebrile in the ED, lungs are CTA, doubt pneumonia. There is no wheezing or signs of respiratory distress. Sxs onset < 7 days, afebrile, no sinus tenderness, doubt acute bacterial sinusitis. Strep negative, exam not consistent w/ RPA/PTA. No evidence of AOM  on exam. No meningeal signs. No history components or rashes to raise concern for tic borne illness. Suspect viral illness, considering mono or covid 19. Covid swab obtained, discussed quarantine. Supportive measures. PCP follow up. I discussed results, treatment plan, need for follow-up, and return precautions with the patient. Provided opportunity for questions, patient confirmed understanding and is in agreement with plan.    Lance Nguyen was evaluated in Emergency Department on 06/08/2019 for the symptoms described in the history of present illness. He/she was evaluated in the context of the global COVID-19 pandemic, which necessitated consideration that the patient might be at risk for infection with the SARS-CoV-2 virus that causes COVID-19. Institutional protocols and algorithms that pertain to the evaluation of patients at risk for COVID-19 are in a state of rapid change  based on information released by regulatory bodies including the CDC and federal and state organizations. These policies and algorithms were followed during the patient's care in the ED.  Final Clinical Impressions(s) / ED Diagnoses   Final diagnoses:  Sore throat    ED Discharge Orders    None       Desmond Lopeetrucelli, Cobe Viney R, PA-C 06/08/19 [phone]    Azalia Bilisampos, Kevin, MD 06/08/19 [phone]

## 2019-06-08 NOTE — Discharge Instructions (Addendum)
Your strep test was negative. We tested you for coronavirus, will call you if these as results are positive Please take Tylenol per over-the-counter dosing to help with your symptoms.  Please be sure to stay well-hydrated.  We are instructing anyone who is positive for coronavirus to quarantine himself for 14 days.  You may be able to discontinue self quarantine if the following conditions are met:   Persons with COVID-19 who have symptoms and were directed to care for themselves at home may discontinue home isolation under the  following conditions: - It has been at least 7 days have passed since symptoms first appeared. - AND at least 3 days (72 hours) have passed since recovery defined as resolution of fever without the use of fever-reducing medications and improvement in respiratory symptoms (e.g., cough, shortness of breath)  Please follow the below quarantine instructions.   Please follow up with primary care within 3-5 days for re-evaluation- call prior to going to the office to make them aware of your symptoms. Return to the ER for new or worsening symptoms including but not limited to increased work of breathing, fever, chest pain, passing out, or any other concerns.       Person Under Monitoring Name: Lance Nguyen  Location: [address] [place] [address]   Infection Prevention Recommendations for Individuals Confirmed to have, or Being Evaluated for, 2019 Novel Coronavirus (COVID-19) Infection Who Receive Care at Home  Individuals who are confirmed to have, or are being evaluated for, COVID-19 should follow the prevention steps below until a healthcare provider or local or state health department says they can return to normal activities.  Stay home except to get medical care You should restrict activities outside your home, except for getting medical care. Do not go to work, school, or public areas, and do not use public transportation or taxis.  Call  ahead before visiting your doctor Before your medical appointment, call the healthcare provider and tell them that you have, or are being evaluated for, COVID-19 infection. This will help the healthcare providers office take steps to keep other people from getting infected. Ask your healthcare provider to call the local or state health department.  Monitor your symptoms Seek prompt medical attention if your illness is worsening (e.g., difficulty breathing). Before going to your medical appointment, call the healthcare provider and tell them that you have, or are being evaluated for, COVID-19 infection. Ask your healthcare provider to call the local or state health department.  Wear a facemask You should wear a facemask that covers your nose and mouth when you are in the same room with other people and when you visit a healthcare provider. People who live with or visit you should also wear a facemask while they are in the same room with you.  Separate yourself from other people in your home As much as possible, you should stay in a different room from other people in your home. Also, you should use a separate bathroom, if available.  Avoid sharing household items You should not share dishes, drinking glasses, cups, eating utensils, towels, bedding, or other items with other people in your home. After using these items, you should wash them thoroughly with soap and water.  Cover your coughs and sneezes Cover your mouth and nose with a tissue when you cough or sneeze, or you can cough or sneeze into your sleeve. Throw used tissues in a lined trash can, and immediately wash your hands with soap and water  for at least 20 seconds or use an alcohol-based hand rub.  Wash your [facility] your hands often and thoroughly with soap and water for at least 20 seconds. You can use an alcohol-based hand sanitizer if soap and water are not available and if your hands are not visibly dirty. Avoid  touching your eyes, nose, and mouth with unwashed hands.   Prevention Steps for Caregivers and Household Members of Individuals Confirmed to have, or Being Evaluated for, COVID-19 Infection Being Cared for in the Home  If you live with, or provide care at home for, a person confirmed to have, or being evaluated for, COVID-19 infection please follow these guidelines to prevent infection:  Follow healthcare providers instructions Make sure that you understand and can help the patient follow any healthcare provider instructions for all care.  Provide for the patients basic needs You should help the patient with basic needs in the home and provide support for getting groceries, prescriptions, and other personal needs.  Monitor the patients symptoms If they are getting sicker, call his or her medical provider and tell them that the patient has, or is being evaluated for, COVID-19 infection. This will help the healthcare providers office take steps to keep other people from getting infected. Ask the healthcare provider to call the local or state health department.  Limit the number of people who have contact with the patient If possible, have only one caregiver for the patient. Other household members should stay in another home or place of residence. If this is not possible, they should stay in another room, or be separated from the patient as much as possible. Use a separate bathroom, if available. Restrict visitors who do not have an essential need to be in the home.  Keep older adults, very young children, and other sick people away from the patient Keep older adults, very young children, and those who have compromised immune systems or chronic health conditions away from the patient. This includes people with chronic heart, lung, or kidney conditions, diabetes, and cancer.  Ensure good ventilation Make sure that shared spaces in the home have good air flow, such as from an air  conditioner or an opened window, weather permitting.  Wash your hands often Wash your hands often and thoroughly with soap and water for at least 20 seconds. You can use an alcohol based hand sanitizer if soap and water are not available and if your hands are not visibly dirty. Avoid touching your eyes, nose, and mouth with unwashed hands. Use disposable paper towels to dry your hands. If not available, use dedicated cloth towels and replace them when they become wet.  Wear a facemask and gloves Wear a disposable facemask at all times in the room and gloves when you touch or have contact with the patients blood, body fluids, and/or secretions or excretions, such as sweat, saliva, sputum, nasal mucus, vomit, urine, or feces.  Ensure the mask fits over your nose and mouth tightly, and do not touch it during use. Throw out disposable facemasks and gloves after using them. Do not reuse. Wash your hands immediately after removing your facemask and gloves. If your personal clothing becomes contaminated, carefully remove clothing and launder. Wash your hands after handling contaminated clothing. Place all used disposable facemasks, gloves, and other waste in a lined container before disposing them with other household waste. Remove gloves and wash your hands immediately after handling these items.  Do not share dishes, glasses, or other household items with  the patient Avoid sharing household items. You should not share dishes, drinking glasses, cups, eating utensils, towels, bedding, or other items with a patient who is confirmed to have, or being evaluated for, COVID-19 infection. After the person uses these items, you should wash them thoroughly with soap and water.  Wash laundry thoroughly Immediately remove and wash clothes or bedding that have blood, body fluids, and/or secretions or excretions, such as sweat, saliva, sputum, nasal mucus, vomit, urine, or feces, on them. Wear gloves when  handling laundry from the patient. Read and follow directions on labels of laundry or clothing items and detergent. In general, wash and dry with the warmest temperatures recommended on the label.  Clean all areas the individual has used often Clean all touchable surfaces, such as counters, tabletops, doorknobs, bathroom fixtures, toilets, phones, keyboards, tablets, and bedside tables, every day. Also, clean any surfaces that may have blood, body fluids, and/or secretions or excretions on them. Wear gloves when cleaning surfaces the patient has come in contact with. Use a diluted bleach solution (e.g., dilute bleach with 1 part bleach and 10 parts water) or a household disinfectant with a label that says EPA-registered for coronaviruses. To make a bleach solution at home, add 1 tablespoon of bleach to 1 quart (4 cups) of water. For a larger supply, add  cup of bleach to 1 gallon (16 cups) of water. Read labels of cleaning products and follow recommendations provided on product labels. Labels contain instructions for safe and effective use of the cleaning product including precautions you should take when applying the product, such as wearing gloves or eye protection and making sure you have good ventilation during use of the product. Remove gloves and wash hands immediately after cleaning.  Monitor yourself for signs and symptoms of illness Caregivers and household members are considered close contacts, should monitor their health, and will be asked to limit movement outside of the home to the extent possible. Follow the monitoring steps for close contacts listed on the symptom monitoring form.   ? If you have additional questions, contact your local health department or call the epidemiologist on call at [phone] (available 24/7). ? This guidance is subject to change. For the most up-to-date guidance from [facility], please refer to their  website: [email or web address].pl

## 2019-07-26 ENCOUNTER — Emergency Department (HOSPITAL_COMMUNITY)
Admission: EM | Admit: 2019-07-26 | Discharge: 2019-07-26 | Disposition: A | Discharge status: Home / Self Care | Payer: Self-pay | Attending: Emergency Medicine | Admitting: Emergency Medicine

## 2019-07-26 ENCOUNTER — Other Ambulatory Visit: Payer: Self-pay

## 2019-07-26 ENCOUNTER — Emergency Department (HOSPITAL_COMMUNITY): Payer: Self-pay

## 2019-07-26 DIAGNOSIS — W3400XA Accidental discharge from unspecified firearms or gun, initial encounter: Secondary | ICD-10-CM

## 2019-07-26 DIAGNOSIS — S81031A Puncture wound without foreign body, right knee, initial encounter: Secondary | ICD-10-CM | POA: Insufficient documentation

## 2019-07-26 DIAGNOSIS — Y999 Unspecified external cause status: Secondary | ICD-10-CM | POA: Insufficient documentation

## 2019-07-26 DIAGNOSIS — Y939 Activity, unspecified: Secondary | ICD-10-CM | POA: Insufficient documentation

## 2019-07-26 DIAGNOSIS — Z791 Long term (current) use of non-steroidal anti-inflammatories (NSAID): Secondary | ICD-10-CM | POA: Insufficient documentation

## 2019-07-26 DIAGNOSIS — Y929 Unspecified place or not applicable: Secondary | ICD-10-CM | POA: Insufficient documentation

## 2019-07-26 LAB — COMPREHENSIVE METABOLIC PANEL
ALT: 18 U/L (ref 0–44)
AST: 25 U/L (ref 15–41)
Albumin: 4.7 g/dL (ref 3.5–5.0)
Alkaline Phosphatase: 60 U/L (ref 38–126)
Anion gap: 10 (ref 5–15)
BUN: 13 mg/dL (ref 6–20)
CO2: 25 mmol/L (ref 22–32)
Calcium: 9.7 mg/dL (ref 8.9–10.3)
Chloride: 104 mmol/L (ref 98–111)
Creatinine, Ser: 1.12 mg/dL (ref 0.61–1.24)
GFR calc Af Amer: >60 mL/min (ref >60)
GFR calc non Af Amer: >60 mL/min (ref >60)
Glucose, Bld: 94 mg/dL (ref 70–99)
Potassium: 4 mmol/L (ref 3.5–5.1)
Sodium: 139 mmol/L (ref 135–145)
Total Bilirubin: 1.2 mg/dL (ref 0.3–1.2)
Total Protein: 8 g/dL (ref 6.5–8.1)

## 2019-07-26 LAB — CBC WITH DIFFERENTIAL/PLATELET
Abs Immature Granulocytes: 0.02 10*3/uL (ref 0.00–0.07)
Basophils Absolute: 0 10*3/uL (ref 0.0–0.1)
Basophils Relative: 0 %
Eosinophils Absolute: 0 10*3/uL (ref 0.0–0.5)
Eosinophils Relative: 0 %
HCT: 47 % (ref 39.0–52.0)
Hemoglobin: 16.6 g/dL (ref 13.0–17.0)
Immature Granulocytes: 0 %
Lymphocytes Relative: 15 %
Lymphs Abs: 1.3 10*3/uL (ref 0.7–4.0)
MCH: 31.7 pg (ref 26.0–34.0)
MCHC: 35.3 g/dL (ref 30.0–36.0)
MCV: 89.9 fL (ref 80.0–100.0)
Monocytes Absolute: 0.9 10*3/uL (ref 0.1–1.0)
Monocytes Relative: 10 %
Neutro Abs: 6.3 10*3/uL (ref 1.7–7.7)
Neutrophils Relative %: 75 %
Platelets: 226 10*3/uL (ref 150–400)
RBC: 5.23 MIL/uL (ref 4.22–5.81)
RDW: 12.4 % (ref 11.5–15.5)
WBC: 8.5 10*3/uL (ref 4.0–10.5)
nRBC: 0 % (ref 0.0–0.2)

## 2019-07-26 LAB — I-STAT CHEM 8, ED
BUN: 16 mg/dL (ref 6–20)
Calcium, Ion: 1.17 mmol/L (ref 1.15–1.40)
Chloride: 105 mmol/L (ref 98–111)
Creatinine, Ser: 1 mg/dL (ref 0.61–1.24)
Glucose, Bld: 90 mg/dL (ref 70–99)
HCT: 48 % (ref 39.0–52.0)
Hemoglobin: 16.3 g/dL (ref 13.0–17.0)
Potassium: 3.9 mmol/L (ref 3.5–5.1)
Sodium: 140 mmol/L (ref 135–145)
TCO2: 26 mmol/L (ref 22–32)

## 2019-07-26 LAB — ABO/RH: ABO/RH(D): O POS

## 2019-07-26 LAB — TYPE AND SCREEN
ABO/RH(D): O POS
Antibody Screen: NEGATIVE

## 2019-07-26 MED ORDER — AMOXICILLIN-POT CLAVULANATE 875-125 MG PO TABS: 1.0000 | ORAL_TABLET | Freq: Two times a day (BID) | ORAL | 0 refills | Status: AC

## 2019-07-26 MED ORDER — OXYCODONE-ACETAMINOPHEN 5-325 MG PO TABS
2.0000 | ORAL_TABLET | Freq: Once | ORAL | Status: AC
  Administered 2019-07-26: 2 via ORAL
  Filled 2019-07-26: qty 2

## 2019-07-26 MED ORDER — HYDROCODONE-ACETAMINOPHEN 5-325 MG PO TABS: 1.0000 | ORAL_TABLET | Freq: Four times a day (QID) | ORAL | 0 refills | Status: AC | PRN

## 2019-07-26 MED ORDER — IBUPROFEN 600 MG PO TABS: 600.0000 mg | ORAL_TABLET | Freq: Four times a day (QID) | ORAL | 0 refills | Status: DC | PRN

## 2019-07-26 MED ORDER — BACITRACIN ZINC 500 UNIT/GM EX OINT: 1.0000 "application " | TOPICAL_OINTMENT | Freq: Two times a day (BID) | CUTANEOUS | 0 refills | Status: AC
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
## 2019-07-26 NOTE — ED Notes (Signed)
Patient transported to X-ray 
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
## 2019-07-26 NOTE — Discharge Planning (Signed)
EDCM to follow for disposition needs.  

## 2019-07-26 NOTE — Progress Notes (Signed)
Chaplain paged to consult space A in the ED for MD to consult with Bayler's family. Lance Celeste (Mom) received the information from the MD well. A police officer was also present to give Lance Celeste some paperwork. Chaplain then escorted mom and uncle from the consult space to the surgical waiting area. Mom and uncle left to go eat. Chaplain gave Mom's cell phone # to the volunteer to give to the recovery room.   Mom Lance Nguyen: [phone]  Chaplain remains available per request.  Chaplain Resident, Evelene Croon, M Div [phone]

## 2020-01-19 ENCOUNTER — Other Ambulatory Visit: Payer: Self-pay

## 2020-01-19 ENCOUNTER — Encounter (HOSPITAL_COMMUNITY): Payer: Self-pay | Admitting: Emergency Medicine

## 2020-01-19 ENCOUNTER — Emergency Department (HOSPITAL_COMMUNITY)
Admission: EM | Admit: 2020-01-19 | Discharge: 2020-01-19 | Disposition: A | Discharge status: Home / Self Care | Payer: No Typology Code available for payment source | Attending: Emergency Medicine | Admitting: Emergency Medicine

## 2020-01-19 DIAGNOSIS — T07XXXA Unspecified multiple injuries, initial encounter: Secondary | ICD-10-CM

## 2020-01-19 DIAGNOSIS — Y929 Unspecified place or not applicable: Secondary | ICD-10-CM | POA: Diagnosis not present

## 2020-01-19 DIAGNOSIS — Y939 Activity, unspecified: Secondary | ICD-10-CM | POA: Insufficient documentation

## 2020-01-19 DIAGNOSIS — Y999 Unspecified external cause status: Secondary | ICD-10-CM | POA: Diagnosis not present

## 2020-01-19 DIAGNOSIS — Z23 Encounter for immunization: Secondary | ICD-10-CM | POA: Diagnosis not present

## 2020-01-19 DIAGNOSIS — S01112A Laceration without foreign body of left eyelid and periocular area, initial encounter: Secondary | ICD-10-CM

## 2020-01-19 DIAGNOSIS — S0081XA Abrasion of other part of head, initial encounter: Secondary | ICD-10-CM | POA: Diagnosis not present

## 2020-01-19 DIAGNOSIS — S0121XA Laceration without foreign body of nose, initial encounter: Secondary | ICD-10-CM | POA: Diagnosis not present

## 2020-01-19 MED ORDER — LIDOCAINE HCL (PF) 1 % IJ SOLN
5.0000 mL | Freq: Once | INTRAMUSCULAR | Status: DC
  Filled 2020-01-19: qty 5

## 2020-01-19 MED ORDER — BACITRACIN ZINC 500 UNIT/GM EX OINT: TOPICAL_OINTMENT | Freq: Two times a day (BID) | CUTANEOUS | Status: DC

## 2020-01-19 MED ORDER — IBUPROFEN 600 MG PO TABS: 600.0000 mg | ORAL_TABLET | Freq: Four times a day (QID) | ORAL | 0 refills | Status: AC | PRN

## 2020-01-19 MED ORDER — TETANUS-DIPHTH-ACELL PERTUSSIS 5-2.5-18.5 LF-MCG/0.5 IM SUSP
0.5000 mL | Freq: Once | INTRAMUSCULAR | Status: AC
  Administered 2020-01-19: 0.5 mL via INTRAMUSCULAR
  Filled 2020-01-19: qty 0.5

## 2020-01-19 MED ORDER — CYCLOBENZAPRINE HCL 10 MG PO TABS: 10.0000 mg | ORAL_TABLET | Freq: Two times a day (BID) | ORAL | 0 refills | Status: AC | PRN

## 2020-01-19 MED ORDER — IBUPROFEN 800 MG PO TABS
800.0000 mg | ORAL_TABLET | Freq: Once | ORAL | Status: AC
  Administered 2020-01-19: 10:00:00 800 mg via ORAL
  Filled 2020-01-19: qty 1
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
## 2020-01-19 NOTE — Discharge Instructions (Signed)
Take ibuprofen and flexeril as needed for aches and pain.  Apply neosporin or bacitracin several times daily over skin abrasions to prevent infection.  You have absorbable sutures to your nose and eyebrow. Follow up with orthopedist as needed.

## 2020-08-10 ENCOUNTER — Encounter (HOSPITAL_COMMUNITY): Payer: Self-pay

## 2020-08-10 ENCOUNTER — Emergency Department (HOSPITAL_COMMUNITY)
Admission: EM | Admit: 2020-08-10 | Discharge: 2020-08-10 | Disposition: A | Discharge status: Home / Self Care | Payer: Self-pay | Attending: Emergency Medicine | Admitting: Emergency Medicine

## 2020-08-10 ENCOUNTER — Other Ambulatory Visit: Payer: Self-pay

## 2020-08-10 DIAGNOSIS — H9202 Otalgia, left ear: Secondary | ICD-10-CM | POA: Insufficient documentation

## 2020-08-10 DIAGNOSIS — H66012 Acute suppurative otitis media with spontaneous rupture of ear drum, left ear: Secondary | ICD-10-CM

## 2020-08-10 MED ORDER — AMOXICILLIN 500 MG PO CAPS: 500.0000 mg | ORAL_CAPSULE | Freq: Three times a day (TID) | ORAL | 0 refills | Status: AC

## 2020-08-10 NOTE — ED Triage Notes (Signed)
Patient c/o left ear pain and states he has had intermittent bleeding from the left ear. Patient states he had a sore throat on 08/02/19, but not now.

## 2020-08-10 NOTE — ED Provider Notes (Signed)
[facility]-EMERGENCY DEPT Provider Note   CSN: [id number] Arrival date & time: 08/10/20  1138     History Chief Complaint  Patient presents with  . Otalgia    Lance Nguyen. is a 24 y.o. male.  Patient is a 24 year old male presenting with complaints of left ear pain.  This has been worsening over the past 3 days.  He describes decreased hearing and pain when he swallows.  He reports a small amount of blood from the ear this morning.  He denies any specific injury or trauma.  He denies any fevers or chills.  The history is provided by the patient.  Otalgia Location:  Left Behind ear:  No abnormality Quality:  Aching Severity:  Moderate Onset quality:  Gradual Duration:  3 days Timing:  Constant Progression:  Worsening Chronicity:  New Relieved by:  Nothing Worsened by:  Swallowing and palpation Ineffective treatments:  OTC medications      History reviewed. No pertinent past medical history.  Patient Active Problem List   Diagnosis Date Noted  . Drug induced hallucinations (HCC) 04/10/2015  . Cannabis dependence with intoxication delirium with complication 04/10/2015  . 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 Alcohol withdrawal (HCC) 04/08/2015  . Psychosis (HCC) 04/06/2015  . Altered mental status   . Psychoactive substance-induced mood disorder (HCC) 04/05/2015  . Hallucinations     History reviewed. No pertinent surgical history.     Family History  Problem Relation Age of Onset  . Healthy Mother     Social History   Tobacco Use  . Smoking status: Never Smoker  . Smokeless tobacco: Never Used  Vaping Use  . Vaping Use: Never used  Substance Use Topics  . Alcohol use: No  . Drug use: Yes    Types: Marijuana    Home Medications Prior to Admission medications   Medication Sig Start Date End Date Taking? Authorizing Provider  amoxicillin-clavulanate (AUGMENTIN) 875-125 MG tablet Take 1  tablet by mouth every 12 (twelve) hours. 07/26/19   Eber Hong, MD  bacitracin ointment Apply 1 application topically 2 (two) times daily. 07/26/19   Eber Hong, MD  cyclobenzaprine (FLEXERIL) 10 MG tablet Take 1 tablet (10 mg total) by mouth 2 (two) times daily as needed for muscle spasms. 01/19/20   Fayrene Helper, PA-C  HYDROcodone-acetaminophen (NORCO/VICODIN) 5-325 MG tablet Take 1 tablet by mouth every 6 (six) hours as needed. 07/26/19   Eber Hong, MD  ibuprofen (ADVIL) 600 MG tablet Take 1 tablet (600 mg total) by mouth every 6 (six) hours as needed. 01/19/20   Fayrene Helper, PA-C  naproxen (NAPROSYN) 375 MG tablet Take 1 tablet (375 mg total) by mouth 2 (two) times daily. 10/16/16   Palumbo, April, MD    Allergies    Patient has no known allergies.  Review of Systems   Review of Systems  HENT: Positive for ear pain.   All other systems reviewed and are negative.   Physical Exam Updated Vital Signs BP 130/89 (BP Location: Left Arm)   Pulse 75   Temp 98.9 F (37.2 C) (Oral)   Resp 16   Ht 6\' 2"  (1.88 m)   Wt 74.8 kg   SpO2 100%   BMI 21.18 kg/m   Physical Exam Vitals and nursing note reviewed.  Constitutional:      General: He is not in acute distress.    Appearance: He is well-developed. He is not diaphoretic.  HENT:  Head: Normocephalic and atraumatic.     Right Ear: Tympanic membrane normal. There is no impacted cerumen.     Left Ear: There is no impacted cerumen.     Ears:     Comments: There is thickening, redness, and inflammation of the left TM.  There is a small area located centrally of trace blood and possible perforation. Cardiovascular:     Heart sounds: No murmur heard.  No friction rub.  Pulmonary:     Effort: Pulmonary effort is normal.  Musculoskeletal:        General: Normal range of motion.     Cervical back: Normal range of motion and neck supple.  Skin:    General: Skin is warm and dry.  Neurological:     Mental Status: He is alert and  oriented to person, place, and time.     Coordination: Coordination normal.     ED Results / Procedures / Treatments   Labs (all labs ordered are listed, but only abnormal results are displayed) Labs Reviewed - No data to display  EKG None  Radiology No results found.  Procedures Procedures (including critical care time)  Medications Ordered in ED Medications - No data to display  ED Course  I have reviewed the triage vital signs and the nursing notes.  Pertinent labs & imaging results that were available during my care of the patient were reviewed by me and considered in my medical decision making (see chart for details).    MDM Rules/Calculators/A&P  Patient presenting with complaints of left ear pain.  He has an obvious otitis media on exam with possible small area of perforation.  Patient will be treated with amoxicillin, continued use of ibuprofen, and as needed return.  Final Clinical Impression(s) / ED Diagnoses Final diagnoses:  None    Rx / DC Orders ED Discharge Orders    None       Geoffery Lyons, MD 08/10/20 1443

## 2020-08-10 NOTE — Discharge Instructions (Addendum)
Begin taking amoxicillin as prescribed.  Take ibuprofen 600 mg every 6 hours as needed for pain.  Follow-up with primary doctor if not improving in the next few days, and follow-up with primary doctor if hearing does not improve in the next 1 to 2 weeks.  Return to the ER in the meantime if symptoms significantly worsen or change.

## 2023-12-20 ENCOUNTER — Other Ambulatory Visit: Payer: Self-pay

## 2023-12-20 ENCOUNTER — Emergency Department (HOSPITAL_COMMUNITY)
Admission: EM | Admit: 2023-12-20 | Discharge: 2023-12-20 | Disposition: A | Discharge status: Home / Self Care | Payer: Medicaid Other | Attending: Emergency Medicine | Admitting: Emergency Medicine

## 2023-12-20 DIAGNOSIS — R202 Paresthesia of skin: Secondary | ICD-10-CM | POA: Diagnosis present

## 2023-12-20 LAB — COMPREHENSIVE METABOLIC PANEL
ALT: 24 U/L (ref 0–44)
AST: 22 U/L (ref 15–41)
Albumin: 3.8 g/dL (ref 3.5–5.0)
Alkaline Phosphatase: 59 U/L (ref 38–126)
Anion gap: 12 (ref 5–15)
BUN: 15 mg/dL (ref 6–20)
CO2: 26 mmol/L (ref 22–32)
Calcium: 9.4 mg/dL (ref 8.9–10.3)
Chloride: 101 mmol/L (ref 98–111)
Creatinine, Ser: 0.99 mg/dL (ref 0.61–1.24)
GFR, Estimated: >60 mL/min (ref >60)
Glucose, Bld: 83 mg/dL (ref 70–99)
Potassium: 4 mmol/L (ref 3.5–5.1)
Sodium: 139 mmol/L (ref 135–145)
Total Bilirubin: 0.4 mg/dL (ref 0.0–1.2)
Total Protein: 7.2 g/dL (ref 6.5–8.1)

## 2023-12-20 LAB — CBC WITH DIFFERENTIAL/PLATELET
Abs Immature Granulocytes: 0.02 10*3/uL (ref 0.00–0.07)
Basophils Absolute: 0 10*3/uL (ref 0.0–0.1)
Basophils Relative: 1 %
Eosinophils Absolute: 0.1 10*3/uL (ref 0.0–0.5)
Eosinophils Relative: 1 %
HCT: 38.7 % — ABNORMAL LOW (ref 39.0–52.0)
Hemoglobin: 13.5 g/dL (ref 13.0–17.0)
Immature Granulocytes: 0 %
Lymphocytes Relative: 43 %
Lymphs Abs: 2.7 10*3/uL (ref 0.7–4.0)
MCH: 33.2 pg (ref 26.0–34.0)
MCHC: 34.9 g/dL (ref 30.0–36.0)
MCV: 95.1 fL (ref 80.0–100.0)
Monocytes Absolute: 0.7 10*3/uL (ref 0.1–1.0)
Monocytes Relative: 11 %
Neutro Abs: 2.7 10*3/uL (ref 1.7–7.7)
Neutrophils Relative %: 44 %
Platelets: 245 10*3/uL (ref 150–400)
RBC: 4.07 MIL/uL — ABNORMAL LOW (ref 4.22–5.81)
RDW: 12.9 % (ref 11.5–15.5)
WBC: 6.2 10*3/uL (ref 4.0–10.5)
nRBC: 0 % (ref 0.0–0.2)

## 2023-12-20 NOTE — ED Provider Notes (Signed)
 [place] EMER[facility]ENC[facility] DEPARTMENT AT [facility] Provider Note  CSN: [id number] Arrival date & time: 12/20/23 9745  Chief Complaint(s) Legs pain/Toes numb  HPI Lance Nguyen. is a 28 y.o. male with a past medical history listed below including polysubstance use disorder here for several weeks of bilateral toe and fingertip numbness and tingling.  Patient reports it has been constant since onset.  No associated pain or trauma.  No weakness.  No redness.  No swelling.  Patient does admit to vaping and inhaling whippits.     HPI  Past Medical History No past medical history on file. Patient Active Problem List   Diagnosis Date Noted   Drug induced hallucinations (HCC) 04/10/2015   Cannabis dependence with intoxication delirium with complication 04/10/2015   Sedative, hypnotic or anxiolytic abuse, episodic (HCC) 04/10/2015   Alcohol withdrawal (HCC) 04/08/2015   Psychosis (HCC) 04/06/2015   Altered mental status    Psychoactive substance-induced mood disorder (HCC) 04/05/2015   Hallucinations    Home Medication(s) Prior to Admission medications   Medication Sig Start Date End Date Taking? Authorizing Provider  amoxicillin  (AMOXIL ) 500 MG capsule Take 1 capsule (500 mg total) by mouth 3 (three) times daily. 08/10/20   Geroldine Berg, MD  amoxicillin -clavulanate (AUGMENTIN ) 875-125 MG tablet Take 1 tablet by mouth every 12 (twelve) hours. 07/26/19   Cleotilde Rogue, MD  bacitracin  ointment Apply 1 application topically 2 (two) times daily. 07/26/19   Cleotilde Rogue, MD  cyclobenzaprine  (FLEXERIL ) 10 MG tablet Take 1 tablet (10 mg total) by mouth 2 (two) times daily as needed for muscle spasms. 01/19/20   Nivia Colon, PA-C  HYDROcodone -acetaminophen  (NORCO/VICODIN) 5-325 MG tablet Take 1 tablet by mouth every 6 (six) hours as needed. 07/26/19   Cleotilde Rogue, MD  ibuprofen  (ADVIL ) 600 MG tablet Take 1 tablet (600 mg total) by mouth every 6 (six) hours as needed. 01/19/20   Nivia Colon, PA-C  naproxen  (NAPROSYN ) 375 MG tablet Take 1 tablet (375 mg total) by mouth 2 (two) times daily. 10/16/16   Palumbo, April, MD                                                                                                                                    Allergies Patient has no known allergies.  Review of Systems Review of Systems As noted in HPI  Physical Exam Vital Signs  I have reviewed the triage vital signs BP 125/81   Pulse 72   Temp 98.1 F (36.7 C) (Oral)   Resp 15   SpO2 100%   Physical Exam Vitals reviewed.  Constitutional:      General: He is not in acute distress.    Appearance: He is well-developed. He is not diaphoretic.  HENT:     Head: Normocephalic and atraumatic.     Right Ear: External ear normal.     Left Ear: External ear normal.  Nose: Nose normal.     Mouth/Throat:     Mouth: Mucous membranes are moist.  Eyes:     General: No scleral icterus.    Conjunctiva/sclera: Conjunctivae normal.  Neck:     Trachea: Phonation normal.  Cardiovascular:     Rate and Rhythm: Normal rate and regular rhythm.  Pulmonary:     Effort: Pulmonary effort is normal. No respiratory distress.     Breath sounds: No stridor.  Abdominal:     General: There is no distension.  Musculoskeletal:        General: Normal range of motion.     Cervical back: Normal range of motion.  Skin:    General: Skin is warm.     Capillary Refill: Capillary refill takes less than 2 seconds.  Neurological:     Mental Status: He is alert and oriented to person, place, and time.  Psychiatric:        Behavior: Behavior normal.     ED Results and Treatments Labs (all labs ordered are listed, but only abnormal results are displayed) Labs Reviewed  CBC WITH DIFFERENTIAL/PLATELET - Abnormal; Notable for the following components:      Result Value   RBC 4.07 (*)    HCT 38.7 (*)    All other components within normal limits  COMPREHENSIVE METABOLIC PANEL                                                                                                                          EKG  EKG Interpretation Date/Time:    Ventricular Rate:    PR Interval:    QRS Duration:    QT Interval:    QTC Calculation:   R Axis:      Text Interpretation:         Radiology No results found.  Medications Ordered in ED Medications - No data to display Procedures Procedures  (including critical care time) Medical Decision Making / ED Course   Medical Decision Making Amount and/or Complexity of Data Reviewed Labs: ordered.    Patient presents with paresthesias in toes and fingers.  No signs of infection or trauma.  Doubt vascular process.  Labs reassuring without electrolyte derangements.  Possibly related to patient's polysubstance use and encouraged him to discontinue.    Final Clinical Impression(s) / ED Diagnoses Final diagnoses:  Paresthesia   The patient appears reasonably screened and/or stabilized for discharge and I doubt any other medical condition or other [facility] requiring further screening, evaluation, or treatment in the ED at this time. I have discussed the findings, Dx and Tx plan with the patient/family who expressed understanding and agree(s) with the plan. Discharge instructions discussed at length. The patient/family was given strict return precautions who verbalized understanding of the instructions. No further questions at time of discharge.  Disposition: Discharge  Condition: Good  ED Discharge Orders     None        Follow Up: Primary care provider  Schedule  an appointment as soon as possible for a visit  if you do not have a primary care physician, contact [facility] at [phone] for referral     This chart was dictated using voice recognition software.  Despite best efforts to proofread,  errors can occur which can change the documentation meaning.    Trine Raynell Moder, MD 12/20/23 [phone]

## 2023-12-20 NOTE — ED Triage Notes (Signed)
 Patient reports pain at both legs with toes and fingers numbness for 3 weeks , denies injury/ambulatory .

## 2024-02-09 ENCOUNTER — Ambulatory Visit: Admission: EM | Admit: 2024-02-09 | Discharge: 2024-02-09 | Disposition: A | Discharge status: Home / Self Care

## 2024-02-09 NOTE — ED Triage Notes (Signed)
 Called pt x3 with NO answer.
